# Patient Record
Sex: Female | Born: 1950 | Race: White | Hispanic: No | Marital: Married | Smoking: Former smoker
Health system: Southern US, Community
[De-identification: ages and names within clinical notes are randomized; demographics above are authoritative.]

## PROBLEM LIST (undated history)

## (undated) DIAGNOSIS — I10 Essential (primary) hypertension: Secondary | ICD-10-CM

## (undated) DIAGNOSIS — R51 Headache: Secondary | ICD-10-CM

## (undated) DIAGNOSIS — S0990XA Unspecified injury of head, initial encounter: Secondary | ICD-10-CM

## (undated) DIAGNOSIS — J189 Pneumonia, unspecified organism: Secondary | ICD-10-CM

## (undated) DIAGNOSIS — R011 Cardiac murmur, unspecified: Secondary | ICD-10-CM

## (undated) DIAGNOSIS — F32A Depression, unspecified: Secondary | ICD-10-CM

## (undated) DIAGNOSIS — J45909 Unspecified asthma, uncomplicated: Secondary | ICD-10-CM

## (undated) DIAGNOSIS — K219 Gastro-esophageal reflux disease without esophagitis: Secondary | ICD-10-CM

## (undated) DIAGNOSIS — M47817 Spondylosis without myelopathy or radiculopathy, lumbosacral region: Secondary | ICD-10-CM

## (undated) DIAGNOSIS — R519 Headache, unspecified: Secondary | ICD-10-CM

## (undated) DIAGNOSIS — F419 Anxiety disorder, unspecified: Secondary | ICD-10-CM

## (undated) DIAGNOSIS — M1990 Unspecified osteoarthritis, unspecified site: Secondary | ICD-10-CM

## (undated) DIAGNOSIS — F329 Major depressive disorder, single episode, unspecified: Secondary | ICD-10-CM

## (undated) DIAGNOSIS — Z973 Presence of spectacles and contact lenses: Secondary | ICD-10-CM

## (undated) HISTORY — PX: COLONOSCOPY: SHX174

## (undated) HISTORY — PX: TONSILLECTOMY: SUR1361

## (undated) HISTORY — PX: TUBAL LIGATION: SHX77

## (undated) HISTORY — PX: OTHER SURGICAL HISTORY: SHX169

## (undated) HISTORY — PX: DG THUMB RIGHT HAND (ARMC HX): HXRAD1825

## (undated) HISTORY — PX: LUMBAR FUSION: SHX111

---

## 2016-12-26 ENCOUNTER — Other Ambulatory Visit: Payer: Self-pay | Admitting: Neurosurgery

## 2016-12-26 DIAGNOSIS — M4316 Spondylolisthesis, lumbar region: Secondary | ICD-10-CM

## 2017-01-06 ENCOUNTER — Ambulatory Visit
Admission: RE | Admit: 2017-01-06 | Discharge: 2017-01-06 | Disposition: A | Discharge status: Home / Self Care | Payer: Medicare Other | Source: Ambulatory Visit | Attending: Neurosurgery | Admitting: Neurosurgery

## 2017-01-06 DIAGNOSIS — M4316 Spondylolisthesis, lumbar region: Secondary | ICD-10-CM

## 2017-01-06 MED ORDER — MEPERIDINE HCL 100 MG/ML IJ SOLN
75.0000 mg | Freq: Once | INTRAMUSCULAR | Status: AC
  Administered 2017-01-06: 75 mg via INTRAMUSCULAR

## 2017-01-06 MED ORDER — ONDANSETRON HCL 4 MG/2ML IJ SOLN
4.0000 mg | Freq: Once | INTRAMUSCULAR | Status: AC
Start: 2017-01-06 — End: 2017-01-06
  Administered 2017-01-06: 4 mg via INTRAMUSCULAR

## 2017-01-06 MED ORDER — DIAZEPAM 5 MG PO TABS
10.0000 mg | ORAL_TABLET | Freq: Once | ORAL | Status: AC
  Administered 2017-01-06: 10 mg via ORAL

## 2017-01-06 MED ORDER — IOPAMIDOL (ISOVUE-M 300) INJECTION 61%
10.0000 mL | Freq: Once | INTRAMUSCULAR | Status: AC | PRN
  Administered 2017-01-06: 10 mL via INTRATHECAL

## 2017-01-06 NOTE — Progress Notes (Signed)
Pt states she has been off Tramadol, Wellbutrin and Prozac on Friday.

## 2017-01-06 NOTE — Discharge Instructions (Signed)
Myelogram Discharge Instructions  1. Go home and rest quietly for the next 24 hours.  It is important to lie flat for the next 24 hours.  Get up only to go to the restroom.  You may lie in the bed or on a couch on your back, your stomach, your left side or your right side.  You may have one pillow under your head.  You may have pillows between your knees while you are on your side or under your knees while you are on your back.  2. DO NOT drive today.  Recline the seat as far back as it will go, while still wearing your seat belt, on the way home.  3. You may get up to go to the bathroom as needed.  You may sit up for 10 minutes to eat.  You may resume your normal diet and medications unless otherwise indicated.  Drink lots of extra fluids today and tomorrow.  4. The incidence of headache, nausea, or vomiting is about 5% (one in 20 patients).  If you develop a headache, lie flat and drink plenty of fluids until the headache goes away.  Caffeinated beverages may be helpful.  If you develop severe nausea and vomiting or a headache that does not go away with flat bed rest, call [phone].  5. You may resume normal activities after your 24 hours of bed rest is over; however, do not exert yourself strongly or do any heavy lifting tomorrow. If when you get up you have a headache when standing, go back to bed and force fluids for another 24 hours.  6. Call your physician for a follow-up appointment.  The results of your myelogram will be sent directly to your physician by the following day.  7. If you have any questions or if complications develop after you arrive home, please call [phone].  Discharge instructions have been explained to the patient.  The patient, or the person responsible for the patient, fully understands these instructions.       May resume Tramadol, Wellbutrin and Prozac on Oct. 31, 2018, after 9:30 am.

## 2017-01-08 ENCOUNTER — Other Ambulatory Visit: Payer: Self-pay | Admitting: Neurosurgery

## 2017-02-03 NOTE — Pre-Procedure Instructions (Signed)
Virgia LandMary L Nunes  02/03/2017      [facility] [facility] [place] [address] [place] [id number] Phone: [phone] Fax: [phone]    Your procedure is scheduled on Thursday November 29.  [facility] at 5:30 A.M.  Call this number if you have problems the morning of surgery:  [phone]   Remember:  Do not eat food or drink liquids after midnight.  Take these medicines the morning of surgery with A SIP OF WATER:   Albuterol inhaler Bupropion (wellbutrin) Dicyclomine (bentyl) if needed Fluoxetine (prozac) BREO ellipta loratadine (claritin) Pantoprazole (protonix) Tramadol (ultram) if needed  7 days prior to surgery STOP taking any Aleve, Naproxen, Ibuprofen, Motrin, Advil, Goody's, BC's, all herbal medications, fish oil, and all vitamins  FOLLOW Doctor's instructions on stopping Aspirin, if no instructions were given, please call surgeon.     Do not wear jewelry, make-up or nail polish.  Do not wear lotions, powders, or perfumes, or deoderant.  Do not shave 48 hours prior to surgery.  Men may shave face and neck.  Do not bring valuables to the hospital.  [facility] is not responsible for any belongings or valuables.  Contacts, dentures or bridgework may not be worn into surgery.  Leave your suitcase in the car.  After surgery it may be brought to your room.  For patients admitted to the hospital, discharge time will be determined by your treatment team.  Patients discharged the day of surgery will not be allowed to drive home.   Special instructions:    Echo- Preparing For Surgery  Before surgery, you can play an important role. Because skin is not sterile, your skin needs to be as free of germs as possible. You can reduce the number of germs on your skin by washing with CHG (chlorahexidine gluconate) Soap before surgery.  CHG is an antiseptic cleaner  which kills germs and bonds with the skin to continue killing germs even after washing.  Please do not use if you have an allergy to CHG or antibacterial soaps. If your skin becomes reddened/irritated stop using the CHG.  Do not shave (including legs and underarms) for at least 48 hours prior to first CHG shower. It is OK to shave your face.  Please follow these instructions carefully.   1. Shower the NIGHT BEFORE SURGERY and the MORNING OF SURGERY with CHG.   2. If you chose to wash your hair, wash your hair first as usual with your normal shampoo.  3. After you shampoo, rinse your hair and body thoroughly to remove the shampoo.  4. Use CHG as you would any other liquid soap. You can apply CHG directly to the skin and wash gently with a scrungie or a clean washcloth.   5. Apply the CHG Soap to your body ONLY FROM THE NECK DOWN.  Do not use on open wounds or open sores. Avoid contact with your eyes, ears, mouth and genitals (private parts). Wash Face and genitals (private parts)  with your normal soap.  6. Wash thoroughly, paying special attention to the area where your surgery will be performed.  7. Thoroughly rinse your body with warm water from the neck down.  8. DO NOT shower/wash with your normal soap after using and rinsing off the CHG Soap.  9. Pat yourself dry with a CLEAN TOWEL.  10. Wear CLEAN PAJAMAS to bed the night before surgery,  wear comfortable clothes the morning of surgery  11. Place CLEAN SHEETS on your bed the night of your first shower and DO NOT SLEEP WITH PETS.    Day of Surgery: Do not apply any deodorants/lotions. Please wear clean clothes to the hospital/surgery center.      Please read over the following fact sheets that you were given. Coughing and Deep Breathing and Surgical Site Infection Prevention

## 2017-02-04 ENCOUNTER — Other Ambulatory Visit: Payer: Self-pay

## 2017-02-04 ENCOUNTER — Encounter (HOSPITAL_COMMUNITY): Payer: Self-pay | Admitting: Urology

## 2017-02-04 ENCOUNTER — Encounter (HOSPITAL_COMMUNITY)
Admission: RE | Admit: 2017-02-04 | Discharge: 2017-02-04 | Disposition: A | Discharge status: Home / Self Care | Payer: Medicare Other | Source: Ambulatory Visit | Attending: Neurosurgery | Admitting: Neurosurgery

## 2017-02-04 HISTORY — DX: Headache, unspecified: R51.9

## 2017-02-04 HISTORY — DX: Headache: R51

## 2017-02-04 HISTORY — DX: Gastro-esophageal reflux disease without esophagitis: K21.9

## 2017-02-04 HISTORY — DX: Unspecified osteoarthritis, unspecified site: M19.90

## 2017-02-04 HISTORY — DX: Essential (primary) hypertension: I10

## 2017-02-04 HISTORY — DX: Major depressive disorder, single episode, unspecified: F32.9

## 2017-02-04 HISTORY — DX: Anxiety disorder, unspecified: F41.9

## 2017-02-04 HISTORY — DX: Cardiac murmur, unspecified: R01.1

## 2017-02-04 HISTORY — DX: Unspecified asthma, uncomplicated: J45.909

## 2017-02-04 HISTORY — DX: Depression, unspecified: F32.A

## 2017-02-04 LAB — BASIC METABOLIC PANEL
Anion gap: 8 (ref 5–15)
BUN: 22 mg/dL — AB (ref 6–20)
CHLORIDE: 106 mmol/L (ref 101–111)
CO2: 24 mmol/L (ref 22–32)
CREATININE: 0.84 mg/dL (ref 0.44–1.00)
Calcium: 9.5 mg/dL (ref 8.9–10.3)
Glucose, Bld: 98 mg/dL (ref 65–99)
POTASSIUM: 4.1 mmol/L (ref 3.5–5.1)
SODIUM: 138 mmol/L (ref 135–145)

## 2017-02-04 LAB — TYPE AND SCREEN
ABO/RH(D): A POS
ANTIBODY SCREEN: NEGATIVE

## 2017-02-04 LAB — ABO/RH: ABO/RH(D): A POS

## 2017-02-04 LAB — CBC
HCT: 35.2 % — ABNORMAL LOW (ref 36.0–46.0)
HEMOGLOBIN: 11.3 g/dL — AB (ref 12.0–15.0)
MCH: 29.8 pg (ref 26.0–34.0)
MCHC: 32.1 g/dL (ref 30.0–36.0)
MCV: 92.9 fL (ref 78.0–100.0)
PLATELETS: 330 10*3/uL (ref 150–400)
RBC: 3.79 MIL/uL — AB (ref 3.87–5.11)
RDW: 14.2 % (ref 11.5–15.5)
WBC: 7.9 10*3/uL (ref 4.0–10.5)

## 2017-02-04 LAB — SURGICAL PCR SCREEN
MRSA, PCR: NEGATIVE
STAPHYLOCOCCUS AUREUS: NEGATIVE

## 2017-02-04 MED ORDER — CHLORHEXIDINE GLUCONATE CLOTH 2 % EX PADS: 6.0000 | MEDICATED_PAD | Freq: Once | CUTANEOUS | Status: DC

## 2017-02-04 NOTE — Progress Notes (Signed)
Anesthesia Chart Review: Patient is a 66 year old female scheduled for PLIF L4-5 on 02/05/17 by Dr. Tressie StalkerJeffrey Jenkins. [place] so her PAT appointment was not until this afternoon.  History includes never smoker, HTN, asthma ("mild"), anxiety, depression, GERD, headaches, arthritis, rectal fistula repair, murmur ("had it forever"; not otherwise specified and anesthesia APP not asked to evaluate at PAT).   No PCP is listed. Records received from Daphene JaegerJohn Garlitz, DO who has given her acupuncture treatments and ultimately referred patient to neurosurgery.  Meds include albuterol, aspirin 81 mg, Wellbutrin XL, Bentyl, diltiazem XT, Prempro, Prozac, Breo Ellipta, Claritin, melatonin, Protonix, Crestor, tramadol.   BP 125/65   Pulse 84   Temp 36.9 C   Resp 20   Ht 5\' 2"  (1.575 m)   Wt 136 lb 4.8 oz (61.8 kg)   SpO2 98%   BMI 24.93 kg/m   [place]02[place]17 [place] [place] [facility] [place].  CT cervical/lumbar myelogram 01/06/17: IMPRESSION: CERVICAL MYELOGRAM: 1. Multilevel degenerative changes of the cervical spine, as described above, worst at C6-C7 where there is moderate central spinal canal stenosis and severe bilateral neuroforaminal stenosis. 2. Mild spinal canal and moderate bilateral neuroforaminal stenosis at C5-C6. 3. Advanced left facet uncovertebral hypertrophy resulting in severe left neuroforaminal stenosis at C3-C4 and moderate neuroforaminal stenosis at C4-C5, which could impinge the exiting left C4 and C5 nerve roots and account for the patient's radiculopathy. 4. Grade 1 anterolisthesis at C4-C5 and C7-T1 due to severe bilateral facet arthropathy. LUMBAR MYELOGRAM: 1. Multilevel degenerative changes of the lumbar spine as described above, worst at L4-L5 where there is mild to moderate central spinal canal stenosis and moderate right neuroforaminal stenosis. 2. Mild central spinal canal stenosis at L3-L4. Mild neuroforaminal stenosis on the left  at L3-L4 and on the right at L5-S1. 3. Grade 1 anterolisthesis of L4 on L5, unchanged with flexion, and slightly improved with extension.  Preoperative labs noted. Cr 0.84, glucose 98. H/H 11.3/35.2.   Labs and EKG acceptable for OR. Anesthesiologist to evaluate on the day of surgery.  Velna Ochsllison Devany Aja, PA-C [facility] Phone [phone] 02/04/2017 4:47 PM

## 2017-02-05 ENCOUNTER — Inpatient Hospital Stay (HOSPITAL_COMMUNITY): Payer: Medicare Other | Admitting: Vascular Surgery

## 2017-02-05 ENCOUNTER — Encounter (HOSPITAL_COMMUNITY): Payer: Self-pay | Admitting: Certified Registered Nurse Anesthetist

## 2017-02-05 ENCOUNTER — Inpatient Hospital Stay (HOSPITAL_COMMUNITY)
Admission: RE | Admit: 2017-02-05 | Discharge: 2017-02-06 | DRG: 455 | Disposition: A | Discharge status: Home / Self Care | Payer: Medicare Other | Source: Ambulatory Visit | Attending: Neurosurgery | Admitting: Neurosurgery

## 2017-02-05 ENCOUNTER — Inpatient Hospital Stay (HOSPITAL_COMMUNITY): Payer: Medicare Other

## 2017-02-05 ENCOUNTER — Inpatient Hospital Stay (HOSPITAL_COMMUNITY): Payer: Medicare Other | Admitting: Certified Registered Nurse Anesthetist

## 2017-02-05 ENCOUNTER — Encounter (HOSPITAL_COMMUNITY)
Admission: RE | Disposition: A | Discharge status: Home / Self Care | Payer: Self-pay | Source: Ambulatory Visit | Attending: Neurosurgery

## 2017-02-05 DIAGNOSIS — J45909 Unspecified asthma, uncomplicated: Secondary | ICD-10-CM | POA: Diagnosis present

## 2017-02-05 DIAGNOSIS — K219 Gastro-esophageal reflux disease without esophagitis: Secondary | ICD-10-CM | POA: Diagnosis present

## 2017-02-05 DIAGNOSIS — M5116 Intervertebral disc disorders with radiculopathy, lumbar region: Secondary | ICD-10-CM | POA: Diagnosis present

## 2017-02-05 DIAGNOSIS — Z7982 Long term (current) use of aspirin: Secondary | ICD-10-CM

## 2017-02-05 DIAGNOSIS — Z791 Long term (current) use of non-steroidal anti-inflammatories (NSAID): Secondary | ICD-10-CM | POA: Diagnosis not present

## 2017-02-05 DIAGNOSIS — Z0183 Encounter for blood typing: Secondary | ICD-10-CM | POA: Diagnosis not present

## 2017-02-05 DIAGNOSIS — Z882 Allergy status to sulfonamides status: Secondary | ICD-10-CM

## 2017-02-05 DIAGNOSIS — M4316 Spondylolisthesis, lumbar region: Secondary | ICD-10-CM | POA: Diagnosis present

## 2017-02-05 DIAGNOSIS — M48062 Spinal stenosis, lumbar region with neurogenic claudication: Secondary | ICD-10-CM | POA: Diagnosis present

## 2017-02-05 DIAGNOSIS — Z888 Allergy status to other drugs, medicaments and biological substances status: Secondary | ICD-10-CM

## 2017-02-05 DIAGNOSIS — Z91013 Allergy to seafood: Secondary | ICD-10-CM | POA: Diagnosis not present

## 2017-02-05 DIAGNOSIS — Z886 Allergy status to analgesic agent status: Secondary | ICD-10-CM | POA: Diagnosis not present

## 2017-02-05 DIAGNOSIS — M549 Dorsalgia, unspecified: Secondary | ICD-10-CM | POA: Diagnosis present

## 2017-02-05 DIAGNOSIS — Z01812 Encounter for preprocedural laboratory examination: Secondary | ICD-10-CM

## 2017-02-05 DIAGNOSIS — F329 Major depressive disorder, single episode, unspecified: Secondary | ICD-10-CM | POA: Diagnosis present

## 2017-02-05 DIAGNOSIS — Z419 Encounter for procedure for purposes other than remedying health state, unspecified: Secondary | ICD-10-CM

## 2017-02-05 DIAGNOSIS — Z79890 Hormone replacement therapy (postmenopausal): Secondary | ICD-10-CM

## 2017-02-05 SURGERY — POSTERIOR LUMBAR FUSION 1 LEVEL
Anesthesia: General

## 2017-02-05 MED ORDER — FENTANYL CITRATE (PF) 250 MCG/5ML IJ SOLN
INTRAMUSCULAR | Status: AC
  Filled 2017-02-05: qty 5

## 2017-02-05 MED ORDER — CEFAZOLIN SODIUM-DEXTROSE 2-4 GM/100ML-% IV SOLN
2.0000 g | INTRAVENOUS | Status: AC
  Administered 2017-02-05: 2 g via INTRAVENOUS
  Filled 2017-02-05: qty 100

## 2017-02-05 MED ORDER — ROCURONIUM BROMIDE 10 MG/ML (PF) SYRINGE
PREFILLED_SYRINGE | INTRAVENOUS | Status: DC | PRN
  Administered 2017-02-05: 10 mg via INTRAVENOUS
  Administered 2017-02-05: 20 mg via INTRAVENOUS
  Administered 2017-02-05: 50 mg via INTRAVENOUS
  Administered 2017-02-05: 20 mg via INTRAVENOUS
  Administered 2017-02-05: 10 mg via INTRAVENOUS

## 2017-02-05 MED ORDER — FENTANYL CITRATE (PF) 100 MCG/2ML IJ SOLN
INTRAMUSCULAR | Status: DC | PRN
  Administered 2017-02-05 (×5): 50 ug via INTRAVENOUS

## 2017-02-05 MED ORDER — PHENOL 1.4 % MT LIQD: 1.0000 | OROMUCOSAL | Status: DC | PRN

## 2017-02-05 MED ORDER — MIDAZOLAM HCL 5 MG/5ML IJ SOLN
INTRAMUSCULAR | Status: DC | PRN
  Administered 2017-02-05: 1 mg via INTRAVENOUS

## 2017-02-05 MED ORDER — OXYCODONE HCL 5 MG PO TABS
5.0000 mg | ORAL_TABLET | Freq: Once | ORAL | Status: AC | PRN
  Administered 2017-02-05: 5 mg via ORAL

## 2017-02-05 MED ORDER — ROSUVASTATIN CALCIUM 20 MG PO TABS: 20.0000 mg | ORAL_TABLET | Freq: Every evening | ORAL | Status: DC

## 2017-02-05 MED ORDER — OXYCODONE HCL 5 MG PO TABS: 5.0000 mg | ORAL_TABLET | ORAL | Status: DC | PRN

## 2017-02-05 MED ORDER — SODIUM CHLORIDE 0.9% FLUSH
3.0000 mL | Freq: Two times a day (BID) | INTRAVENOUS | Status: DC
  Administered 2017-02-05 (×2): 3 mL via INTRAVENOUS

## 2017-02-05 MED ORDER — PANTOPRAZOLE SODIUM 40 MG PO TBEC
40.0000 mg | DELAYED_RELEASE_TABLET | Freq: Every day | ORAL | Status: DC | PRN
  Filled 2017-02-05: qty 1

## 2017-02-05 MED ORDER — SODIUM CHLORIDE 0.9% FLUSH: 3.0000 mL | INTRAVENOUS | Status: DC | PRN

## 2017-02-05 MED ORDER — ONDANSETRON HCL 4 MG/2ML IJ SOLN
INTRAMUSCULAR | Status: AC
  Filled 2017-02-05: qty 2

## 2017-02-05 MED ORDER — BUPROPION HCL ER (XL) 300 MG PO TB24
300.0000 mg | ORAL_TABLET | Freq: Every day | ORAL | Status: DC
  Filled 2017-02-05: qty 1

## 2017-02-05 MED ORDER — ZOLPIDEM TARTRATE 5 MG PO TABS: 5.0000 mg | ORAL_TABLET | Freq: Every evening | ORAL | Status: DC | PRN

## 2017-02-05 MED ORDER — OXYCODONE HCL 5 MG PO TABS
ORAL_TABLET | ORAL | Status: AC
Start: 2017-02-05 — End: 2017-02-06
  Filled 2017-02-05: qty 1

## 2017-02-05 MED ORDER — LORATADINE 10 MG PO TABS: 10.0000 mg | ORAL_TABLET | Freq: Every day | ORAL | Status: DC | PRN

## 2017-02-05 MED ORDER — 0.9 % SODIUM CHLORIDE (POUR BTL) OPTIME
TOPICAL | Status: DC | PRN
  Administered 2017-02-05: 1000 mL

## 2017-02-05 MED ORDER — CEFAZOLIN SODIUM-DEXTROSE 2-4 GM/100ML-% IV SOLN
2.0000 g | Freq: Three times a day (TID) | INTRAVENOUS | Status: AC
  Administered 2017-02-05 (×2): 2 g via INTRAVENOUS
  Filled 2017-02-05 (×2): qty 100

## 2017-02-05 MED ORDER — BUPIVACAINE HCL (PF) 0.5 % IJ SOLN
INTRAMUSCULAR | Status: AC
  Filled 2017-02-05: qty 30

## 2017-02-05 MED ORDER — HYDROMORPHONE HCL 1 MG/ML IJ SOLN
INTRAMUSCULAR | Status: AC
  Administered 2017-02-05: 0.5 mg via INTRAVENOUS
  Filled 2017-02-05: qty 1

## 2017-02-05 MED ORDER — ALBUTEROL SULFATE (2.5 MG/3ML) 0.083% IN NEBU: 2.5000 mg | INHALATION_SOLUTION | RESPIRATORY_TRACT | Status: DC | PRN

## 2017-02-05 MED ORDER — LACTATED RINGERS IV SOLN
INTRAVENOUS | Status: DC | PRN
  Administered 2017-02-05: 07:00:00 via INTRAVENOUS

## 2017-02-05 MED ORDER — VANCOMYCIN HCL 1000 MG IV SOLR
INTRAVENOUS | Status: DC | PRN
  Administered 2017-02-05: 1000 mg

## 2017-02-05 MED ORDER — PROPOFOL 10 MG/ML IV BOLUS
INTRAVENOUS | Status: DC | PRN
  Administered 2017-02-05: 150 mg via INTRAVENOUS

## 2017-02-05 MED ORDER — HYDROMORPHONE HCL 1 MG/ML IJ SOLN
INTRAMUSCULAR | Status: AC
  Filled 2017-02-05: qty 1

## 2017-02-05 MED ORDER — DOCUSATE SODIUM 100 MG PO CAPS
100.0000 mg | ORAL_CAPSULE | Freq: Two times a day (BID) | ORAL | Status: DC
  Administered 2017-02-05 – 2017-02-06 (×2): 100 mg via ORAL
  Filled 2017-02-05 (×2): qty 1

## 2017-02-05 MED ORDER — VANCOMYCIN HCL 1000 MG IV SOLR
INTRAVENOUS | Status: AC
  Filled 2017-02-05: qty 1000

## 2017-02-05 MED ORDER — BISACODYL 10 MG RE SUPP: 10.0000 mg | Freq: Every day | RECTAL | Status: DC | PRN

## 2017-02-05 MED ORDER — SUGAMMADEX SODIUM 200 MG/2ML IV SOLN
INTRAVENOUS | Status: DC | PRN
  Administered 2017-02-05: 150 mg via INTRAVENOUS

## 2017-02-05 MED ORDER — FLUTICASONE FUROATE-VILANTEROL 100-25 MCG/INH IN AEPB
1.0000 | INHALATION_SPRAY | Freq: Every day | RESPIRATORY_TRACT | Status: DC | PRN
  Filled 2017-02-05: qty 28

## 2017-02-05 MED ORDER — ACETAMINOPHEN 650 MG RE SUPP: 650.0000 mg | RECTAL | Status: DC | PRN

## 2017-02-05 MED ORDER — MORPHINE SULFATE (PF) 4 MG/ML IV SOLN
4.0000 mg | INTRAVENOUS | Status: DC | PRN
  Administered 2017-02-05: 4 mg via INTRAVENOUS
  Filled 2017-02-05: qty 1

## 2017-02-05 MED ORDER — DICYCLOMINE HCL 20 MG PO TABS
20.0000 mg | ORAL_TABLET | Freq: Every day | ORAL | Status: DC | PRN
  Filled 2017-02-05: qty 1

## 2017-02-05 MED ORDER — FLUOXETINE HCL 20 MG PO CAPS
40.0000 mg | ORAL_CAPSULE | Freq: Every day | ORAL | Status: DC
  Administered 2017-02-06: 40 mg via ORAL
  Filled 2017-02-05: qty 2

## 2017-02-05 MED ORDER — ALBUMIN HUMAN 5 % IV SOLN
INTRAVENOUS | Status: DC | PRN
  Administered 2017-02-05: 09:00:00 via INTRAVENOUS

## 2017-02-05 MED ORDER — CYCLOBENZAPRINE HCL 10 MG PO TABS
ORAL_TABLET | ORAL | Status: AC
  Administered 2017-02-05: 10 mg via ORAL
  Filled 2017-02-05: qty 1

## 2017-02-05 MED ORDER — HEMOSTATIC AGENTS (NO CHARGE) OPTIME
TOPICAL | Status: DC | PRN
  Administered 2017-02-05: 1 via TOPICAL

## 2017-02-05 MED ORDER — PHENYLEPHRINE HCL 10 MG/ML IJ SOLN
INTRAVENOUS | Status: DC | PRN
  Administered 2017-02-05: 15 ug/min via INTRAVENOUS

## 2017-02-05 MED ORDER — THROMBIN (RECOMBINANT) 5000 UNITS EX SOLR
CUTANEOUS | Status: AC
  Filled 2017-02-05: qty 5000

## 2017-02-05 MED ORDER — VITAMIN D 1000 UNITS PO TABS
2000.0000 [IU] | ORAL_TABLET | Freq: Every day | ORAL | Status: DC
  Filled 2017-02-05: qty 2

## 2017-02-05 MED ORDER — ACETAMINOPHEN 325 MG PO TABS
650.0000 mg | ORAL_TABLET | ORAL | Status: DC | PRN
  Administered 2017-02-06: 650 mg via ORAL
  Filled 2017-02-05: qty 2

## 2017-02-05 MED ORDER — LIDOCAINE 2% (20 MG/ML) 5 ML SYRINGE
INTRAMUSCULAR | Status: AC
  Filled 2017-02-05: qty 10

## 2017-02-05 MED ORDER — ONDANSETRON HCL 4 MG PO TABS: 4.0000 mg | ORAL_TABLET | Freq: Four times a day (QID) | ORAL | Status: DC | PRN

## 2017-02-05 MED ORDER — BACITRACIN ZINC 500 UNIT/GM EX OINT
TOPICAL_OINTMENT | CUTANEOUS | Status: DC | PRN
  Administered 2017-02-05: 1 via TOPICAL

## 2017-02-05 MED ORDER — HYDROMORPHONE HCL 1 MG/ML IJ SOLN
0.2500 mg | INTRAMUSCULAR | Status: DC | PRN
  Administered 2017-02-05 (×3): 0.5 mg via INTRAVENOUS

## 2017-02-05 MED ORDER — BUPIVACAINE LIPOSOME 1.3 % IJ SUSP
20.0000 mL | INTRAMUSCULAR | Status: AC
  Administered 2017-02-05: 20 mL
  Filled 2017-02-05: qty 20

## 2017-02-05 MED ORDER — LIDOCAINE 2% (20 MG/ML) 5 ML SYRINGE
INTRAMUSCULAR | Status: DC | PRN
  Administered 2017-02-05: 60 mg via INTRAVENOUS

## 2017-02-05 MED ORDER — DEXAMETHASONE SODIUM PHOSPHATE 10 MG/ML IJ SOLN
INTRAMUSCULAR | Status: DC | PRN
  Administered 2017-02-05: 10 mg via INTRAVENOUS

## 2017-02-05 MED ORDER — ONDANSETRON HCL 4 MG/2ML IJ SOLN: 4.0000 mg | Freq: Four times a day (QID) | INTRAMUSCULAR | Status: DC | PRN

## 2017-02-05 MED ORDER — ROCURONIUM BROMIDE 10 MG/ML (PF) SYRINGE
PREFILLED_SYRINGE | INTRAVENOUS | Status: AC
  Filled 2017-02-05: qty 10

## 2017-02-05 MED ORDER — CYCLOBENZAPRINE HCL 10 MG PO TABS
10.0000 mg | ORAL_TABLET | Freq: Three times a day (TID) | ORAL | Status: DC | PRN
  Administered 2017-02-05 – 2017-02-06 (×4): 10 mg via ORAL
  Filled 2017-02-05 (×3): qty 1

## 2017-02-05 MED ORDER — DILTIAZEM HCL ER COATED BEADS 300 MG PO CP24
300.0000 mg | ORAL_CAPSULE | Freq: Every evening | ORAL | Status: DC
Start: 2017-02-05 — End: 2017-02-06
  Administered 2017-02-05: 300 mg via ORAL
  Filled 2017-02-05 (×2): qty 1

## 2017-02-05 MED ORDER — OXYCODONE HCL 5 MG PO TABS
10.0000 mg | ORAL_TABLET | ORAL | Status: DC | PRN
  Administered 2017-02-05 – 2017-02-06 (×7): 10 mg via ORAL
  Filled 2017-02-05 (×7): qty 2

## 2017-02-05 MED ORDER — DEXAMETHASONE SODIUM PHOSPHATE 10 MG/ML IJ SOLN
INTRAMUSCULAR | Status: AC
  Filled 2017-02-05: qty 1

## 2017-02-05 MED ORDER — OXYCODONE HCL 5 MG/5ML PO SOLN: 5.0000 mg | Freq: Once | ORAL | Status: AC | PRN

## 2017-02-05 MED ORDER — THROMBIN (RECOMBINANT) 20000 UNITS EX SOLR
CUTANEOUS | Status: AC
  Filled 2017-02-05: qty 20000

## 2017-02-05 MED ORDER — MENTHOL 3 MG MT LOZG: 1.0000 | LOZENGE | OROMUCOSAL | Status: DC | PRN

## 2017-02-05 MED ORDER — FENOFIBRATE 160 MG PO TABS
160.0000 mg | ORAL_TABLET | Freq: Every day | ORAL | Status: DC
Start: 2017-02-05 — End: 2017-02-06
  Filled 2017-02-05: qty 1

## 2017-02-05 MED ORDER — CONJ ESTROG-MEDROXYPROGEST ACE 0.3-1.5 MG PO TABS
1.0000 | ORAL_TABLET | Freq: Every day | ORAL | Status: DC
  Filled 2017-02-05: qty 1

## 2017-02-05 MED ORDER — BUPIVACAINE-EPINEPHRINE (PF) 0.5% -1:200000 IJ SOLN
INTRAMUSCULAR | Status: DC | PRN
  Administered 2017-02-05: 10 mL via PERINEURAL

## 2017-02-05 MED ORDER — MIDAZOLAM HCL 2 MG/2ML IJ SOLN
INTRAMUSCULAR | Status: AC
  Filled 2017-02-05: qty 2

## 2017-02-05 MED ORDER — PROPOFOL 10 MG/ML IV BOLUS
INTRAVENOUS | Status: AC
  Filled 2017-02-05: qty 40

## 2017-02-05 MED ORDER — LACTATED RINGERS IV SOLN
INTRAVENOUS | Status: DC
  Administered 2017-02-05: 15:00:00 via INTRAVENOUS

## 2017-02-05 MED ORDER — SODIUM CHLORIDE 0.9 % IR SOLN
Status: DC | PRN
  Administered 2017-02-05: 08:00:00

## 2017-02-05 MED ORDER — ONDANSETRON HCL 4 MG/2ML IJ SOLN
INTRAMUSCULAR | Status: DC | PRN
  Administered 2017-02-05: 4 mg via INTRAVENOUS

## 2017-02-05 MED ORDER — THROMBIN (RECOMBINANT) 5000 UNITS EX SOLR
CUTANEOUS | Status: DC | PRN
  Administered 2017-02-05: 5000 [IU] via TOPICAL

## 2017-02-05 MED ORDER — BACITRACIN ZINC 500 UNIT/GM EX OINT
TOPICAL_OINTMENT | CUTANEOUS | Status: AC
  Filled 2017-02-05: qty 28.35

## 2017-02-05 MED FILL — Heparin Sodium (Porcine) Inj 1000 Unit/ML: INTRAMUSCULAR | Qty: 30 | Status: AC

## 2017-02-05 MED FILL — Sodium Chloride IV Soln 0.9%: INTRAVENOUS | Qty: 1000 | Status: AC

## 2017-02-05 SURGICAL SUPPLY — 63 items
BAG DECANTER FOR FLEXI CONT (MISCELLANEOUS) ×3 IMPLANT
BASKET BONE COLLECTION (BASKET) ×3 IMPLANT
BENZOIN TINCTURE PRP APPL 2/3 (GAUZE/BANDAGES/DRESSINGS) ×3 IMPLANT
BLADE CLIPPER SURG (BLADE) IMPLANT
BUR MATCHSTICK NEURO 3.0 LAGG (BURR) ×3 IMPLANT
BUR PRECISION FLUTE 6.0 (BURR) ×3 IMPLANT
CAGE ALTERA 10X31X9-13 15D (Cage) ×2 IMPLANT
CAGE ALTERA 9-13-15-31MM (Cage) ×1 IMPLANT
CANISTER SUCT 3000ML PPV (MISCELLANEOUS) ×3 IMPLANT
CAP REVERE LOCKING (Cap) ×12 IMPLANT
CARTRIDGE OIL MAESTRO DRILL (MISCELLANEOUS) ×1 IMPLANT
CLOSURE WOUND 1/2 X4 (GAUZE/BANDAGES/DRESSINGS) ×1
CONT SPEC 4OZ CLIKSEAL STRL BL (MISCELLANEOUS) ×3 IMPLANT
COVER BACK TABLE 60X90IN (DRAPES) ×6 IMPLANT
DERMABOND ADVANCED (GAUZE/BANDAGES/DRESSINGS) ×2
DERMABOND ADVANCED .7 DNX12 (GAUZE/BANDAGES/DRESSINGS) ×1 IMPLANT
DIFFUSER DRILL AIR PNEUMATIC (MISCELLANEOUS) ×3 IMPLANT
DRAPE C-ARM 42X72 X-RAY (DRAPES) ×6 IMPLANT
DRAPE HALF SHEET 40X57 (DRAPES) ×3 IMPLANT
DRAPE LAPAROTOMY 100X72X124 (DRAPES) ×3 IMPLANT
DRAPE POUCH INSTRU U-SHP 10X18 (DRAPES) ×3 IMPLANT
DRAPE SURG 17X23 STRL (DRAPES) ×12 IMPLANT
ELECT BLADE 4.0 EZ CLEAN MEGAD (MISCELLANEOUS) ×3
ELECT REM PT RETURN 9FT ADLT (ELECTROSURGICAL) ×3
ELECTRODE BLDE 4.0 EZ CLN MEGD (MISCELLANEOUS) ×1 IMPLANT
ELECTRODE REM PT RTRN 9FT ADLT (ELECTROSURGICAL) ×1 IMPLANT
EVACUATOR 1/8 PVC DRAIN (DRAIN) IMPLANT
GAUZE SPONGE 4X4 12PLY STRL (GAUZE/BANDAGES/DRESSINGS) ×3 IMPLANT
GAUZE SPONGE 4X4 16PLY XRAY LF (GAUZE/BANDAGES/DRESSINGS) ×3 IMPLANT
GLOVE BIO SURGEON STRL SZ8 (GLOVE) ×6 IMPLANT
GLOVE BIO SURGEON STRL SZ8.5 (GLOVE) ×6 IMPLANT
GLOVE EXAM NITRILE LRG STRL (GLOVE) IMPLANT
GLOVE EXAM NITRILE XL STR (GLOVE) IMPLANT
GLOVE EXAM NITRILE XS STR PU (GLOVE) IMPLANT
GOWN STRL REUS W/ TWL LRG LVL3 (GOWN DISPOSABLE) ×1 IMPLANT
GOWN STRL REUS W/ TWL XL LVL3 (GOWN DISPOSABLE) ×2 IMPLANT
GOWN STRL REUS W/TWL 2XL LVL3 (GOWN DISPOSABLE) ×3 IMPLANT
GOWN STRL REUS W/TWL LRG LVL3 (GOWN DISPOSABLE) ×2
GOWN STRL REUS W/TWL XL LVL3 (GOWN DISPOSABLE) ×4
HEMOSTAT POWDER SURGIFOAM 1G (HEMOSTASIS) ×3 IMPLANT
KIT BASIN OR (CUSTOM PROCEDURE TRAY) ×3 IMPLANT
KIT ROOM TURNOVER OR (KITS) ×3 IMPLANT
NEEDLE HYPO 21X1.5 SAFETY (NEEDLE) IMPLANT
NEEDLE HYPO 22GX1.5 SAFETY (NEEDLE) ×3 IMPLANT
NS IRRIG 1000ML POUR BTL (IV SOLUTION) ×3 IMPLANT
OIL CARTRIDGE MAESTRO DRILL (MISCELLANEOUS) ×3
PACK LAMINECTOMY NEURO (CUSTOM PROCEDURE TRAY) ×3 IMPLANT
PAD ARMBOARD 7.5X6 YLW CONV (MISCELLANEOUS) ×9 IMPLANT
PATTIES SURGICAL .5 X1 (DISPOSABLE) IMPLANT
ROD REVERE 6.35 40MM (Rod) ×6 IMPLANT
SCREW 7.5X45MM (Screw) ×12 IMPLANT
SPONGE LAP 4X18 X RAY DECT (DISPOSABLE) IMPLANT
SPONGE NEURO XRAY DETECT 1X3 (DISPOSABLE) ×3 IMPLANT
SPONGE SURGIFOAM ABS GEL 100 (HEMOSTASIS) IMPLANT
STRIP BIOACTIVE 20CC 25X100X8 (Miscellaneous) ×3 IMPLANT
STRIP CLOSURE SKIN 1/2X4 (GAUZE/BANDAGES/DRESSINGS) ×2 IMPLANT
SUT VIC AB 1 CT1 18XBRD ANBCTR (SUTURE) ×2 IMPLANT
SUT VIC AB 1 CT1 8-18 (SUTURE) ×4
SUT VIC AB 2-0 CP2 18 (SUTURE) ×6 IMPLANT
TOWEL GREEN STERILE (TOWEL DISPOSABLE) ×3 IMPLANT
TOWEL GREEN STERILE FF (TOWEL DISPOSABLE) ×3 IMPLANT
TRAY FOLEY W/METER SILVER 16FR (SET/KITS/TRAYS/PACK) ×3 IMPLANT
WATER STERILE IRR 1000ML POUR (IV SOLUTION) ×3 IMPLANT

## 2017-02-05 NOTE — H&P (Signed)
Subjective: The patient is a 66 year old white female who has complained of back, buttock and leg pain consistent with neurogenic claudication.  She has failed medical management and was worked up with a lumbar myelo CT.  This demonstrated a L4-5 spinal stasis with spinal stenosis.  I discussed the various treatment options with the patient.  She has decided to proceed with surgery.  Past Medical History:  Diagnosis Date  . Anxiety   . Arthritis   . Asthma    "mild"  . Depression   . GERD (gastroesophageal reflux disease)   . Headache   . Heart murmur    "had it forever"   . Hypertension     Past Surgical History:  Procedure Laterality Date  . CESAREAN SECTION    . DG THUMB RIGHT HAND ([facility] HX)     joint replacement  . rectal fistula repair    . TUBAL LIGATION      Allergies  Allergen Reactions  . Phoslo [Calcium Acetate] Other (See Comments)    Body aches   . Shellfish Allergy Hives  . Sulfa Antibiotics Hives  . Mobic [Meloxicam] Nausea Only    Social History   Tobacco Use  . Smoking status: Never Smoker  . Smokeless tobacco: Never Used  Substance Use Topics  . Alcohol use: Yes    Alcohol/week: 1.8 oz    Types: 3 Glasses of wine per week    Comment: weekly     History reviewed. No pertinent family history. Prior to Admission medications   Medication Sig Start Date End Date Taking? Authorizing Provider  aspirin EC 81 MG tablet Take 81 mg daily by mouth.   Yes [provider]  buPROPion (WELLBUTRIN XL) 300 MG 24 hr tablet Take 300 mg by mouth daily.   Yes [provider]  CALCIUM PO Take 1,200 mg daily by mouth.   Yes [provider]  Cholecalciferol (VITAMIN D) 2000 units CAPS Take 2,000 Units daily by mouth.   Yes [provider]  dicyclomine (BENTYL) 20 MG tablet Take 20 mg daily as needed by mouth (upset stomach).    Yes [provider]  diltiazem (CARTIA XT) 300 MG 24 hr capsule Take 300 mg every evening by mouth.     Yes [provider]  estrogen, conjugated,-medroxyprogesterone (PREMPRO) 0.3-1.5 MG tablet Take 0.5 tablets daily by mouth.    Yes [provider]  fenofibrate micronized (LOFIBRA) 134 MG capsule Take 134 mg every evening by mouth.    Yes [provider]  FLUoxetine (PROZAC) 40 MG capsule Take 40 mg by mouth daily.   Yes [provider]  fluticasone furoate-vilanterol (BREO ELLIPTA) 100-25 MCG/INH AEPB Inhale 1 puff daily as needed into the lungs (shortness of breath).    Yes [provider]  loratadine (CLARITIN) 10 MG tablet Take 10 mg daily as needed by mouth for allergies or rhinitis.   Yes [provider]  Melatonin 10 MG CAPS Take 10 mg at bedtime by mouth.    Yes [provider]  Multiple Vitamins-Minerals (HAIR/SKIN/NAILS) TABS Take 2 tablets daily at 12 noon by mouth.   Yes [provider]  naproxen sodium (ANAPROX) 220 MG tablet Take 220 mg daily by mouth.    Yes [provider]  pantoprazole (PROTONIX) 40 MG tablet Take 40 mg daily as needed by mouth (acid reflux).   Yes [provider]  rosuvastatin (CRESTOR) 20 MG tablet Take 20 mg every evening by mouth.    Yes  [provider]  traMADol (ULTRAM) 50 MG tablet Take 50 mg 4 (four) times daily as needed by mouth for severe pain.    Yes [provider]  Vaginal Lubricant (REPLENS) GEL Place 1 application 2 (two) times a week vaginally.   Yes [provider]  albuterol (PROVENTIL HFA;VENTOLIN HFA) 108 (90 Base) MCG/ACT inhaler Inhale 1 puff every 4 (four) hours as needed into the lungs for wheezing or shortness of breath.     [provider]     Review of Systems  Positive ROS: As above  All other systems have been reviewed and were otherwise negative with the exception of those mentioned in the HPI and as above.  Objective: Vital signs in last 24 hours: Temp:  [98.4 F (36.9 C)] 98.4 F (36.9 C) (11/29  0646) Pulse Rate:  [84] 84 (11/29 0645) Resp:  [20] 20 (11/29 0645) BP: (125-151)/(65-79) 151/79 (11/29 0645) SpO2:  [98 %] 98 % (11/29 0645) Weight:  [61.8 kg (136 lb 4.8 oz)] 61.8 kg (136 lb 4.8 oz) (11/28 1453)  General Appearance: Alert Head: Normocephalic, without obvious abnormality, atraumatic Eyes: PERRL, conjunctiva/corneas clear, EOM's intact,    Ears: Normal  Throat: Normal  Neck: Supple, Back: unremarkable Lungs: Clear to auscultation bilaterally, respirations unlabored Heart: Regular rate and rhythm, no murmur, rub or gallop Abdomen: Soft, non-tender Extremities: Extremities normal, atraumatic, no cyanosis or edema Skin: unremarkable  NEUROLOGIC:   Mental status: alert and oriented,Motor Exam - grossly normal Sensory Exam - grossly normal Reflexes:  Coordination - grossly normal Gait - grossly normal Balance - grossly normal Cranial Nerves: I: smell Not tested  II: visual acuity  OS: Normal  OD: Normal   II: visual fields Full to confrontation  II: pupils Equal, round, reactive to light  III,VII: ptosis None  III,IV,VI: extraocular muscles  Full ROM  V: mastication Normal  V: facial light touch sensation  Normal  V,VII: corneal reflex  Present  VII: facial muscle function - upper  Normal  VII: facial muscle function - lower Normal  VIII: hearing Not tested  IX: soft palate elevation  Normal  IX,X: gag reflex Present  XI: trapezius strength  5/5  XI: sternocleidomastoid strength 5/5  XI: neck flexion strength  5/5  XII: tongue strength  Normal    Data Review Lab Results  Component Value Date   WBC 7.9 02/04/2017   HGB 11.3 (L) 02/04/2017   HCT 35.2 (L) 02/04/2017   MCV 92.9 02/04/2017   PLT 330 02/04/2017   Lab Results  Component Value Date   NA 138 02/04/2017   K 4.1 02/04/2017   CL 106 02/04/2017   CO2 24 02/04/2017   BUN 22 (H) 02/04/2017   CREATININE 0.84 02/04/2017   GLUCOSE 98 02/04/2017   No results found for: INR,  PROTIME  Assessment/Plan: L4-5 spondylolisthesis, spinal stenosis, neurogenic claudication, lumbar radiculopathy, lumbago: I have discussed the situation with the patient.  I have reviewed her imaging studies with her and pointed out the abnormalities.  We have discussed the various treatment options including surgery.  I have described the surgical treatment option of an L4-5 decompression, instrumentation, and fusion.  I have shown her surgical models.  I have given her a surgical pamphlet.  We have discussed the risks, benefits, alternatives, expected postoperative course, and likelihood of achieving our goals with surgery.  I have answered all her questions.  She has decided to proceed with surgery.   Cristi LoronJeffrey D Alek Borges 02/05/2017 7:23 AM

## 2017-02-05 NOTE — Progress Notes (Signed)
Subjective:   I saw the patient in the PACU.  She was alert and pleasant and in no apparent distress.  Objective: Vital signs in last 24 hours: Temp:  [97.2 F (36.2 C)-99 F (37.2 C)] 99 F (37.2 C) (11/29 1342) Pulse Rate:  [84-104] 104 (11/29 1300) Resp:  [11-20] 18 (11/29 1342) BP: (118-151)/(72-80) 120/72 (11/29 1342) SpO2:  [94 %-98 %] 94 % (11/29 1342)  Intake/Output from previous day: No intake/output data recorded. Intake/Output this shift: Total I/O In: 1550 [I.V.:1200; IV Piggyback:350] Out: 510 [Urine:360; Blood:150]  Physical exam   The patient is alert and pleasant.  She is moving her lower extremities well.  Lab Results: Recent Labs    02/04/17 1524  WBC 7.9  HGB 11.3*  HCT 35.2*  PLT 330   BMET Recent Labs    02/04/17 1524  NA 138  K 4.1  CL 106  CO2 24  GLUCOSE 98  BUN 22*  CREATININE 0.84  CALCIUM 9.5    Studies/Results: Dg Lumbar Spine 2-3 Views  Result Date: 02/05/2017 CLINICAL DATA:  Lumbar spine surgery. EXAM: DG C-ARM 61-120 MIN; LUMBAR SPINE - 2-3 VIEW COMPARISON:  02/05/2017. FINDINGS: Lumbar spine numbered as per prior exam. L4-L5 posterior pedicle screws. L4-L5 interbody fusion. Hardware intact. Stable anatomic alignment. IMPRESSION: Postsurgical changes lumbar spine. Electronically Signed   By: Maisie Fushomas  Register   On: 02/05/2017 11:13   Dg Lumbar Spine 1 View  Result Date: 02/05/2017 CLINICAL DATA:  Lumbar spine surgery. EXAM: LUMBAR SPINE - 1 VIEW COMPARISON:  No prior. FINDINGS: Lumbar spine numbered with the lowest segmented appearing lumbar shaped vertebrae on lateral view as L5. Metallic marker noted posteriorly at the L5 level. Surgical sponge noted over the posterior lower back. Mild anterolisthesis L4 on L5. Mild L1 compression fracture. IMPRESSION: Metallic marker noted posteriorly at the L5 level. Electronically Signed   By: Maisie Fushomas  Register   On: 02/05/2017 09:25   Dg C-arm 1-60 Min  Result Date: 02/05/2017 CLINICAL  DATA:  Lumbar spine surgery. EXAM: DG C-ARM 61-120 MIN; LUMBAR SPINE - 2-3 VIEW COMPARISON:  02/05/2017. FINDINGS: Lumbar spine numbered as per prior exam. L4-L5 posterior pedicle screws. L4-L5 interbody fusion. Hardware intact. Stable anatomic alignment. IMPRESSION: Postsurgical changes lumbar spine. Electronically Signed   By: Maisie Fushomas  Register   On: 02/05/2017 11:13    Assessment/Plan:   The patient is doing well.  I spoke with her husband.  LOS: 0 days     Cristi LoronJeffrey D Ohana Birdwell 02/05/2017, 2:55 PM

## 2017-02-05 NOTE — Op Note (Signed)
Brief history: The patient is a 66 year old white female who has complained of back, buttock and leg pain consistent with neurogenic claudication.  She was worked up with a lumbar myelo CT which demonstrated an L4-5 spondylolisthesis, facet arthropathy, spinal stenosis, etc.  I discussed the various treatment options with the patient including surgery.  She has weighed the risks, benefits, and alternatives to surgery and decided proceed with an L4-5 decompression, instrumentation, and fusion.  Preoperative diagnosis: L4-5 spondylolisthesis, facet arthropathy, degenerative disc disease, spinal stenosis compressing both the L4 and the L5 nerve roots; lumbago; lumbar radiculopathy: Neurogenic claudication  Postoperative diagnosis: The same  Procedure: Bilateral L4-5 laminotomy/foraminotomies to decompress the bilateral L4 and L5 nerve roots(the work required to do this was in addition to the work required to do the posterior lumbar interbody fusion because of the patient's spinal stenosis, facet arthropathy. Etc. requiring a wide decompression of the nerve roots.);  L4-5 transforaminal lumbar interbody fusion with local morselized autograft bone and Kinnex graft extender; insertion of interbody prosthesis at L4-5 (globus peek expandable interbody prosthesis); posterior nonsegmental instrumentation from L4 to L5 with globus titanium pedicle screws and rods; posterior lateral arthrodesis at L4-5 with local morselized autograft bone and Kinnex bone graft extender.  Surgeon: Dr. Delma OfficerJeff Adreanne Yono  Asst.: Dr. Wynetta Emeryram  Anesthesia: Gen. endotracheal  Estimated blood loss: 200 cc  Drains: None  Complications: None  Description of procedure: The patient was brought to the operating room by the anesthesia team. General endotracheal anesthesia was induced. The patient was turned to the prone position on the Wilson frame. The patient's lumbosacral region was then prepared with Betadine scrub and Betadine solution.  Sterile drapes were applied.  I then injected the area to be incised with Marcaine with epinephrine solution. I then used the scalpel to make a linear midline incision over the L4-5 interspace. I then used electrocautery to perform a bilateral subperiosteal dissection exposing the spinous process and lamina of L4-5. We then obtained intraoperative radiograph to confirm our location. We then inserted the Verstrac retractor to provide exposure.  I began the decompression by using the high speed drill to perform laminotomies at L4-5 bilaterally. We then used the Kerrison punches to widen the laminotomy and removed the ligamentum flavum at L4-5 bilaterally. We used the Kerrison punches to remove the medial facets at L4-5 bilaterally. We performed wide foraminotomies about the bilateral L4 and L5 nerve roots completing the decompression.  We now turned our attention to the posterior lumbar interbody fusion. I used a scalpel to incise the intervertebral disc at L4-5 bilaterally. I then performed a partial intervertebral discectomy at L4-5 bilaterally using the pituitary forceps. We prepared the vertebral endplates at L4-5 bilaterally for the fusion by removing the soft tissues with the curettes. We then used the trial spacers to pick the appropriate sized interbody prosthesis. We prefilled his prosthesis with a combination of local morselized autograft bone that we obtained during the decompression as well as Kinnex bone graft extender. We inserted the prefilled prosthesis into the interspace at L4-5 bilaterally. There was a good snug fit of the prosthesis in the interspace. We then filled and the remainder of the intervertebral disc space with local morselized autograft bone and Kinnex. This completed the posterior lumbar interbody arthrodesis.  We now turned attention to the instrumentation. Under fluoroscopic guidance we cannulated the bilateral L4 and L5 pedicles with the bone probe. We then removed the bone  probe. We then tapped the pedicle with a 6.5 millimeter tap. We then  removed the tap. We probed inside the tapped pedicle with a ball probe to rule out cortical breaches. We then inserted a  6.5 millimeter pedicle screw into the L4 and L5 pedicles bilaterally under fluoroscopic guidance. We then palpated along the medial aspect of the pedicles to rule out cortical breaches. There were none. The nerve roots were not injured. We then connected the unilateral pedicle screws with a lordotic rod. We compressed the construct and secured the rod in place with the caps. We then tightened the caps appropriately. This completed the instrumentation from L4-5.  We now turned our attention to the posterior lateral arthrodesis at L4-5. We used the high-speed drill to decorticate the remainder of the facets, pars, transverse process at L4-5. We then applied a combination of local morselized autograft bone and Kinnex bone graft extender over these decorticated posterior lateral structures. This completed the posterior lateral arthrodesis.  We then obtained hemostasis using bipolar electrocautery. We irrigated the wound out with bacitracin solution. We inspected the thecal sac and nerve roots and noted they were well decompressed. We then removed the retractor. We placed vancomycin powder in the wound. We reapproximated patient's thoracolumbar fascia with interrupted #1 Vicryl suture. We reapproximated patient's subcutaneous tissue with interrupted 2-0 Vicryl suture. The reapproximated patient's skin with Steri-Strips and benzoin. The wound was then coated with bacitracin ointment. A sterile dressing was applied. The drapes were removed. The patient was subsequently returned to the supine position where they were extubated by the anesthesia team. He was then transported to the post anesthesia care unit in stable condition. All sponge instrument and needle counts were reportedly correct at the end of this case.

## 2017-02-05 NOTE — Transfer of Care (Signed)
Immediate Anesthesia Transfer of Care Note  Patient: Lindsey MeadMary L Quinnell  Procedure(s) Performed: POSTERIOR LUMBAR INTERBODY FUSION, INTERBODY PROSTHESIS, POSTERIOR INSTRUMENTATION LUMBAR FOUR- LUMBAR FIVE (N/A )  Patient Location: PACU  Anesthesia Type:General  Level of Consciousness: alert , drowsy and patient cooperative  Airway & Oxygen Therapy: Patient Spontanous Breathing and Patient connected to nasal cannula oxygen  Post-op Assessment: Report given to RN and Post -op Vital signs reviewed and stable  Post vital signs: Reviewed and stable  Last Vitals:  Vitals:   02/05/17 0645 02/05/17 0646  BP: (!) 151/79   Pulse: 84   Resp: 20   Temp:  36.9 C  SpO2: 98%     Last Pain: There were no vitals filed for this visit.       Complications: No apparent anesthesia complications

## 2017-02-05 NOTE — Evaluation (Signed)
Physical Therapy Evaluation Patient Details Name: Lindsey MeadMary L Hill MRN: [id number] DOB: 09/27/1950 Today's Date: 02/05/2017   History of Present Illness  Pt is a 66 y/o female s/p L4-5 PLIF. PMH includes HTN, asthma, and GERD.   Clinical Impression  Patient is s/p above surgery resulting in the deficits listed below (see PT Problem List). PTA, pt was independent with functional mobility. Upon eval, pt very limited by pain. Very guarded during gait and required min A for steadying and use of HHA and IV pole. Anticipate pt will progress well once pain controlled. Reports husband will be able to assist at d/c and will need DME below.  Patient will benefit from skilled PT to increase their independence and safety with mobility (while adhering to their precautions) to allow discharge to the venue listed below. Will continue to follow acutely.      Follow Up Recommendations No PT follow up;Supervision for mobility/OOB    Equipment Recommendations  Rolling walker with 5" wheels;3in1 (PT)    Recommendations for Other Services       Precautions / Restrictions Precautions Precautions: Back Precaution Booklet Issued: Yes (comment) Precaution Comments: Reviewed back precautions with pt.  Required Braces or Orthoses: Spinal Brace Spinal Brace: Lumbar corset;Applied in sitting position Restrictions Weight Bearing Restrictions: No      Mobility  Bed Mobility Overal bed mobility: Needs Assistance Bed Mobility: Rolling;Sidelying to Sit;Sit to Sidelying Rolling: Min assist Sidelying to sit: Min assist     Sit to sidelying: Min assist General bed mobility comments: Pt very guarded and requiring min A for rolling. Also required min A for trunk elevation to come up to sitting. Min A for LE lift assist upon return to sidelying. Verbal cues for use of log roll technique.   Transfers Overall transfer level: Needs assistance Equipment used: 1 person hand held assist Transfers: Sit to/from Stand Sit  to Stand: Min assist         General transfer comment: Min A for lift assist and steadying assist. Verbal cues to power through LEs.   Ambulation/Gait Ambulation/Gait assistance: Min assist Ambulation Distance (Feet): 75 Feet Assistive device: 1 person hand held assist(IV pole ) Gait Pattern/deviations: Step-through pattern;Decreased stride length Gait velocity: Decreased Gait velocity interpretation: Below normal speed for age/gender General Gait Details: Very guarded gait this session. Pt also reporting dizziness which limtied ambulation distance. Required min A and HHA/use of IV pole for steadying. Educated about use of RW at home to increase independence and safety with mobility. Educated about generalized walking program to perform at home.   Stairs            Wheelchair Mobility    Modified Rankin (Stroke Patients Only)       Balance Overall balance assessment: Needs assistance Sitting-balance support: Feet supported;No upper extremity supported Sitting balance-Leahy Scale: Fair     Standing balance support: Bilateral upper extremity supported;During functional activity Standing balance-Leahy Scale: Poor Standing balance comment: Reliant on UE support fir balance                             Pertinent Vitals/Pain Pain Assessment: Faces Faces Pain Scale: Hurts even more Pain Descriptors / Indicators: Aching;Operative site guarding Pain Intervention(s): Limited activity within patient's tolerance;Monitored during session;Repositioned;Patient requesting pain meds-RN notified    Home Living Family/patient expects to be discharged to:: Private residence Living Arrangements: Spouse/significant other Available Help at Discharge: Family;Available 24 hours/day Type of Home: House Home Access:  Stairs to enter Entrance Stairs-Rails: Doctor, general practiceight;Left Entrance Stairs-Number of Steps: 5 Home Layout: Multi-level;Able to live on main level with bedroom/bathroom Home  Equipment: None      Prior Function Level of Independence: Independent               Hand Dominance        Extremity/Trunk Assessment   Upper Extremity Assessment Upper Extremity Assessment: Defer to OT evaluation    Lower Extremity Assessment Lower Extremity Assessment: Generalized weakness    Cervical / Trunk Assessment Cervical / Trunk Assessment: Other exceptions Cervical / Trunk Exceptions: s/p PLIF   Communication   Communication: No difficulties  Cognition Arousal/Alertness: Awake/alert Behavior During Therapy: WFL for tasks assessed/performed Overall Cognitive Status: Within Functional Limits for tasks assessed                                        General Comments General comments (skin integrity, edema, etc.): Pt's husband present during session     Exercises     Assessment/Plan    PT Assessment Patient needs continued PT services  PT Problem List Decreased strength;Decreased balance;Decreased mobility;Decreased activity tolerance;Decreased knowledge of use of DME;Decreased knowledge of precautions;Pain       PT Treatment Interventions DME instruction;Gait training;Stair training;Functional mobility training;Therapeutic activities;Therapeutic exercise;Balance training;Neuromuscular re-education;Patient/family education    PT Goals (Current goals can be found in the Care Plan section)  Acute Rehab PT Goals Patient Stated Goal: to decrease pain  PT Goal Formulation: With patient Time For Goal Achievement: 02/12/17 Potential to Achieve Goals: Good    Frequency Min 5X/week   Barriers to discharge        Co-evaluation               AM-PAC PT "6 Clicks" Daily Activity  Outcome Measure Difficulty turning over in bed (including adjusting bedclothes, sheets and blankets)?: Unable Difficulty moving from lying on back to sitting on the side of the bed? : Unable Difficulty sitting down on and standing up from a chair with  arms (e.g., wheelchair, bedside commode, etc,.)?: Unable Help needed moving to and from a bed to chair (including a wheelchair)?: A Little Help needed walking in hospital room?: A Little Help needed climbing 3-5 steps with a railing? : A Lot 6 Click Score: 11    End of Session Equipment Utilized During Treatment: Gait belt;Back brace Activity Tolerance: Patient limited by pain Patient left: in bed;with call bell/phone within reach;with nursing/sitter in room;with family/visitor present Nurse Communication: Mobility status PT Visit Diagnosis: Other abnormalities of gait and mobility (R26.89);Pain Pain - part of body: (back )    Time: [id number] PT Time Calculation (min) (ACUTE ONLY): 21 min   Charges:   PT Evaluation $PT Eval Low Complexity: 1 Low     PT G Codes:        Gladys DammeBrittany Mikael Skoda, PT, DPT  Acute Rehabilitation Services  Pager: [phone]   Lehman PromBrittany S Zach Tietje 02/05/2017, 4:42 PM

## 2017-02-05 NOTE — Anesthesia Procedure Notes (Signed)
Procedure Name: Intubation Date/Time: 02/05/2017 7:40 AM Performed by: Waynard EdwardsSmith, Alexei Ey A, CRNA Pre-anesthesia Checklist: Patient identified, Emergency Drugs available, Suction available and Patient being monitored Patient Re-evaluated:Patient Re-evaluated prior to induction Oxygen Delivery Method: Circle system utilized Preoxygenation: Pre-oxygenation with 100% oxygen Induction Type: IV induction Ventilation: Mask ventilation without difficulty and Oral airway inserted - appropriate to patient size Laryngoscope Size: Hyacinth MeekerMiller and 2 Grade View: Grade I Tube type: Oral Tube size: 7.0 mm Number of attempts: 1 Airway Equipment and Method: Stylet Placement Confirmation: ETT inserted through vocal cords under direct vision,  positive ETCO2 and breath sounds checked- equal and bilateral Secured at: 21 cm Tube secured with: Tape Dental Injury: Teeth and Oropharynx as per pre-operative assessment

## 2017-02-05 NOTE — Anesthesia Preprocedure Evaluation (Signed)
Anesthesia Evaluation  Patient identified by MRN, date of birth, ID band Patient awake    Reviewed: Allergy & Precautions, NPO status , Patient's Chart, lab work & pertinent test results  Airway Mallampati: I  TM Distance: >3 FB Neck ROM: Full    Dental  (+) Teeth Intact   Pulmonary asthma ,    breath sounds clear to auscultation       Cardiovascular hypertension,  Rhythm:Regular Rate:Normal     Neuro/Psych  Headaches,    GI/Hepatic Neg liver ROS, GERD  ,  Endo/Other  negative endocrine ROS  Renal/GU negative Renal ROS     Musculoskeletal  (+) Arthritis ,   Abdominal   Peds  Hematology negative hematology ROS (+)   Anesthesia Other Findings   Reproductive/Obstetrics                             Anesthesia Physical Anesthesia Plan  ASA: II  Anesthesia Plan: General   Post-op Pain Management:    Induction: Intravenous  PONV Risk Score and Plan: 4 or greater and Ondansetron, Dexamethasone, Midazolam and Treatment may vary due to age or medical condition  Airway Management Planned: Oral ETT  Additional Equipment:   Intra-op Plan:   Post-operative Plan: Extubation in OR  Informed Consent: I have reviewed the patients History and Physical, chart, labs and discussed the procedure including the risks, benefits and alternatives for the proposed anesthesia with the patient or authorized representative who has indicated his/her understanding and acceptance.   Dental advisory given  Plan Discussed with: CRNA  Anesthesia Plan Comments:         Anesthesia Quick Evaluation

## 2017-02-05 NOTE — Anesthesia Postprocedure Evaluation (Signed)
Anesthesia Post Note  Patient: Lindsey Hill  Procedure(s) Performed: POSTERIOR LUMBAR INTERBODY FUSION, INTERBODY PROSTHESIS, POSTERIOR INSTRUMENTATION LUMBAR FOUR- LUMBAR FIVE (N/A )     Patient location during evaluation: PACU Anesthesia Type: General Level of consciousness: awake, sedated and oriented Pain management: pain level controlled Vital Signs Assessment: post-procedure vital signs reviewed and stable Respiratory status: spontaneous breathing, nonlabored ventilation, respiratory function stable and patient connected to nasal cannula oxygen Cardiovascular status: blood pressure returned to baseline and stable Postop Assessment: no apparent nausea or vomiting Anesthetic complications: no    Last Vitals:  Vitals:   02/05/17 1300 02/05/17 1342  BP: 118/77 120/72  Pulse: (!) 104   Resp: 12 18  Temp: 36.8 C 37.2 C  SpO2: 96% 94%    Last Pain:  Vitals:   02/05/17 1412  PainSc: 8                  Giovan Pinsky,JAMES TERRILL

## 2017-02-06 LAB — CBC
HEMATOCRIT: 29.9 % — AB (ref 36.0–46.0)
Hemoglobin: 9.3 g/dL — ABNORMAL LOW (ref 12.0–15.0)
MCH: 29.6 pg (ref 26.0–34.0)
MCHC: 31.1 g/dL (ref 30.0–36.0)
MCV: 95.2 fL (ref 78.0–100.0)
PLATELETS: 284 10*3/uL (ref 150–400)
RBC: 3.14 MIL/uL — AB (ref 3.87–5.11)
RDW: 14.7 % (ref 11.5–15.5)
WBC: 12.2 10*3/uL — AB (ref 4.0–10.5)

## 2017-02-06 LAB — BASIC METABOLIC PANEL
ANION GAP: 7 (ref 5–15)
BUN: 15 mg/dL (ref 6–20)
CALCIUM: 8.9 mg/dL (ref 8.9–10.3)
CO2: 25 mmol/L (ref 22–32)
Chloride: 106 mmol/L (ref 101–111)
Creatinine, Ser: 0.85 mg/dL (ref 0.44–1.00)
GLUCOSE: 110 mg/dL — AB (ref 65–99)
POTASSIUM: 4.5 mmol/L (ref 3.5–5.1)
Sodium: 138 mmol/L (ref 135–145)

## 2017-02-06 MED ORDER — CYCLOBENZAPRINE HCL 10 MG PO TABS: 10.0000 mg | ORAL_TABLET | Freq: Three times a day (TID) | ORAL | 1 refills | Status: DC | PRN

## 2017-02-06 MED ORDER — OXYCODONE-ACETAMINOPHEN 5-325 MG PO TABS: 1.0000 | ORAL_TABLET | ORAL | 0 refills | Status: DC | PRN

## 2017-02-06 MED ORDER — DOCUSATE SODIUM 100 MG PO CAPS: 100.0000 mg | ORAL_CAPSULE | Freq: Two times a day (BID) | ORAL | 0 refills | Status: DC

## 2017-02-06 MED ORDER — OXYCODONE-ACETAMINOPHEN 5-325 MG PO TABS
1.0000 | ORAL_TABLET | ORAL | Status: DC | PRN
  Administered 2017-02-06: 2 via ORAL
  Filled 2017-02-06: qty 2

## 2017-02-06 NOTE — Progress Notes (Signed)
Patient alert and oriented, mae's well, voiding adequate amount of urine, swallowing without difficulty,  C/o mild pain at time of discharge. Patient discharged home with family. Script and discharged instructions given to patient. Patient and family stated understanding of instructions given. Patient has an appointment with Dr. Lovell SheehanJenkins

## 2017-02-06 NOTE — Progress Notes (Signed)
Physical Therapy Treatment Patient Details Name: Lindsey MeadMary L Hill MRN: [id number] DOB: 08/08/1950 Today's Date: 02/06/2017    History of Present Illness Pt is a 66 y/o female s/p L4-5 PLIF. PMH includes HTN, asthma, and GERD.     PT Comments    Pt making steady progress with mobility and successfully completed stair training this session with min guard for safety. PT will continue to f/u with pt to ensure a safe d/c home.     Follow Up Recommendations  No PT follow up;Supervision for mobility/OOB     Equipment Recommendations  Rolling walker with 5" wheels;3in1 (PT);Other (comment)(YOUTH SIZED RW)    Recommendations for Other Services       Precautions / Restrictions Precautions Precautions: Back Precaution Booklet Issued: Yes (comment) Precaution Comments: Reviewed back precautions with pt.  Required Braces or Orthoses: Spinal Brace Spinal Brace: Lumbar corset;Applied in sitting position Restrictions Weight Bearing Restrictions: No    Mobility  Bed Mobility Overal bed mobility: Needs Assistance Bed Mobility: Rolling;Sidelying to Sit Rolling: Supervision Sidelying to sit: Supervision       General bed mobility comments: supervision for safety, good use of log roll technique  Transfers Overall transfer level: Needs assistance Equipment used: None Transfers: Sit to/from Stand Sit to Stand: Min guard         General transfer comment: for safety  Ambulation/Gait Ambulation/Gait assistance: Min assist;Min guard Ambulation Distance (Feet): 100 Feet(20' x1, 100' x1) Assistive device: 1 person hand held assist;Rolling walker (2 wheeled) Gait Pattern/deviations: Step-through pattern;Decreased stride length Gait velocity: Decreased Gait velocity interpretation: Below normal speed for age/gender General Gait Details: without use of an AD pt with modest instability requiring constant min A; with use of RW pt with improved balance with min guard for  safety   Stairs Stairs: Yes   Stair Management: Two rails;Step to pattern;Forwards Number of Stairs: 4 General stair comments: guarded, cautious, min guard for safety  Wheelchair Mobility    Modified Rankin (Stroke Patients Only)       Balance Overall balance assessment: Needs assistance Sitting-balance support: Feet supported;No upper extremity supported Sitting balance-Leahy Scale: Fair     Standing balance support: Bilateral upper extremity supported;During functional activity Standing balance-Leahy Scale: Poor Standing balance comment: Reliant on UE support                             Cognition Arousal/Alertness: Awake/alert Behavior During Therapy: WFL for tasks assessed/performed Overall Cognitive Status: Within Functional Limits for tasks assessed                                        Exercises      General Comments        Pertinent Vitals/Pain Pain Assessment: Faces Faces Pain Scale: Hurts little more Pain Location: back Pain Descriptors / Indicators: Aching;Operative site guarding Pain Intervention(s): Monitored during session;Repositioned    Home Living                      Prior Function            PT Goals (current goals can now be found in the care plan section) Acute Rehab PT Goals PT Goal Formulation: With patient Time For Goal Achievement: 02/12/17 Potential to Achieve Goals: Good Progress towards PT goals: Progressing toward goals    Frequency  Min 5X/week      PT Plan Current plan remains appropriate    Co-evaluation              AM-PAC PT "6 Clicks" Daily Activity  Outcome Measure  Difficulty turning over in bed (including adjusting bedclothes, sheets and blankets)?: None Difficulty moving from lying on back to sitting on the side of the bed? : None Difficulty sitting down on and standing up from a chair with arms (e.g., wheelchair, bedside commode, etc,.)?: A Little Help  needed moving to and from a bed to chair (including a wheelchair)?: A Little Help needed walking in hospital room?: A Little Help needed climbing 3-5 steps with a railing? : A Little 6 Click Score: 20    End of Session Equipment Utilized During Treatment: Gait belt;Back brace Activity Tolerance: Patient limited by fatigue;Patient limited by pain Patient left: in chair;with call bell/phone within reach Nurse Communication: Mobility status PT Visit Diagnosis: Other abnormalities of gait and mobility (R26.89);Pain Pain - part of body: (back)     Time: [id number] PT Time Calculation (min) (ACUTE ONLY): 14 min  Charges:  $Gait Training: 8-22 mins                    G Codes:       Dennis PortJennifer Lenus Trauger, [place]PT, [place]PT [phone]    Alessandra BevelsJennifer M Tremel Setters 02/06/2017, 10:17 AM

## 2017-02-06 NOTE — Discharge Summary (Signed)
  Physician Discharge Summary  Patient ID: Lindsey MeadMary L Hill MRN: [id number] DOB/AGE: 65/06/1950 65 y.o.  Admit date: 02/05/2017 Discharge date: 02/06/2017  Admission Diagnoses: L4-5 spondylolisthesis, facet arthropathy, spinal stenosis, lumbago, lumbar radiculopathy, neurogenic claudication  Discharge Diagnoses: The same Active Problems:   Spondylolisthesis of lumbar region   Discharged Condition: good  Hospital Course: I performed an L4-5 decompression, instrumentation, and fusion on the patient on 02/05/2017.  The surgery went well.  The patient's postoperative course was unremarkable.  On postoperative day #1 the patient requested discharge home.  She was given written and oral discharge instructions.  All her questions were answered.  Consults: Physical therapy Significant Diagnostic Studies: None Treatments: L4-5 decompression, and septation, and fusion. Discharge Exam: Blood pressure 114/79, pulse (!) 106, temperature 98.4 F (36.9 C), temperature source Oral, resp. rate 18, SpO2 97 %. The patient is alert and pleasant.  She looks well.  Her strength is normal in her lower extremities.  Disposition: Home  Discharge Instructions    Call MD for:  difficulty breathing, headache or visual disturbances   Complete by:  As directed    Call MD for:  extreme fatigue   Complete by:  As directed    Call MD for:  hives   Complete by:  As directed    Call MD for:  persistant dizziness or light-headedness   Complete by:  As directed    Call MD for:  persistant nausea and vomiting   Complete by:  As directed    Call MD for:  redness, tenderness, or signs of infection (pain, swelling, redness, odor or green/yellow discharge around incision site)   Complete by:  As directed    Call MD for:  severe uncontrolled pain   Complete by:  As directed    Call MD for:  temperature >100.4   Complete by:  As directed    Diet - low sodium heart healthy   Complete by:  As directed    Discharge  instructions   Complete by:  As directed    Call [phone] for a followup appointment. Take a stool softener while you are using pain medications.   Driving Restrictions   Complete by:  As directed    Do not drive for 2 weeks.   Increase activity slowly   Complete by:  As directed    Lifting restrictions   Complete by:  As directed    Do not lift more than 5 pounds. No excessive bending or twisting.   May shower / Bathe   Complete by:  As directed    He may shower after the pain she is removed 3 days after surgery. Leave the incision alone.   Remove dressing in 48 hours   Complete by:  As directed    Your stitches are under the scan and will dissolve by themselves. The Steri-Strips will fall off after you take a few showers. Do not rub back or pick at the wound, Leave the wound alone.        Signed: Cristi LoronJeffrey D Psalm Arman 02/06/2017, 11:43 AM

## 2017-02-06 NOTE — Plan of Care (Signed)
  Progressing Activity: Ability to avoid complications of mobility impairment will improve 02/06/2017 0618 - Progressing by Wille CelesteMadriaga-Acosta, Garet Hooton D, RN Ability to tolerate increased activity will improve 02/06/2017 0618 - Progressing by Wille CelesteMadriaga-Acosta, Lincoln Kleiner D, RN Will remain free from falls 02/06/2017 0618 - Progressing by Wille CelesteMadriaga-Acosta, Ashika Apuzzo D, RN Education: Ability to verbalize activity precautions or restrictions will improve 02/06/2017 0618 - Progressing by Wille CelesteMadriaga-Acosta, Ardyn Forge D, RN Knowledge of the prescribed therapeutic regimen will improve 02/06/2017 0618 - Progressing by Wille CelesteMadriaga-Acosta, Jamesyn Lindell D, RN Understanding of discharge needs will improve 02/06/2017 0618 - Progressing by Wille CelesteMadriaga-Acosta, Nickisha Hum D, RN Physical Regulation: Ability to maintain clinical measurements within normal limits will improve 02/06/2017 0618 - Progressing by Wille CelesteMadriaga-Acosta, Bless Lisenby D, RN Postoperative complications will be avoided or minimized 02/06/2017 0618 - Progressing by Wille CelesteMadriaga-Acosta, Mercedes Valeriano D, RN Diagnostic test results will improve 02/06/2017 0618 - Progressing by Wille CelesteMadriaga-Acosta, Josearmando Kuhnert D, RN Pain Management: Pain level will decrease 02/06/2017 0618 - Progressing by Wille CelesteMadriaga-Acosta, Bailee Thall D, RN Health Behavior/Discharge Planning: Identification of resources available to assist in meeting health care needs will improve 02/06/2017 0618 - Progressing by Wille CelesteMadriaga-Acosta, Shirlena Brinegar D, RN

## 2017-09-02 ENCOUNTER — Other Ambulatory Visit: Payer: Self-pay | Admitting: Neurosurgery

## 2017-09-02 ENCOUNTER — Telehealth: Payer: Self-pay | Admitting: Nurse Practitioner

## 2017-09-02 DIAGNOSIS — M5412 Radiculopathy, cervical region: Secondary | ICD-10-CM

## 2017-09-02 DIAGNOSIS — M545 Low back pain: Secondary | ICD-10-CM

## 2017-09-02 DIAGNOSIS — G8929 Other chronic pain: Secondary | ICD-10-CM

## 2017-09-02 NOTE — Telephone Encounter (Signed)
Phone call to patient to verify medication list and allergies for myelogram procedure. Pt informed she will need to stop Wellbutrin and Prozac 48hrs prior to myelogram appointment time. Pt verbalized understanding.

## 2017-09-21 ENCOUNTER — Ambulatory Visit
Admission: RE | Admit: 2017-09-21 | Discharge: 2017-09-21 | Disposition: A | Discharge status: Home / Self Care | Payer: Medicare Other | Source: Ambulatory Visit | Attending: Neurosurgery | Admitting: Neurosurgery

## 2017-09-21 VITALS — BP 114/69 | HR 69

## 2017-09-21 DIAGNOSIS — M5412 Radiculopathy, cervical region: Secondary | ICD-10-CM

## 2017-09-21 DIAGNOSIS — M545 Low back pain: Secondary | ICD-10-CM

## 2017-09-21 DIAGNOSIS — M4316 Spondylolisthesis, lumbar region: Secondary | ICD-10-CM

## 2017-09-21 DIAGNOSIS — G8929 Other chronic pain: Secondary | ICD-10-CM

## 2017-09-21 MED ORDER — IOPAMIDOL (ISOVUE-M 300) INJECTION 61%
10.0000 mL | Freq: Once | INTRAMUSCULAR | Status: AC | PRN
  Administered 2017-09-21: 10 mL via INTRATHECAL

## 2017-09-21 MED ORDER — HYDROMORPHONE HCL 1 MG/ML IJ SOLN
1.0000 mg | Freq: Once | INTRAMUSCULAR | Status: AC
  Administered 2017-09-21: 1 mg via INTRAMUSCULAR

## 2017-09-21 MED ORDER — DIAZEPAM 5 MG PO TABS
5.0000 mg | ORAL_TABLET | Freq: Once | ORAL | Status: AC
  Administered 2017-09-21: 5 mg via ORAL

## 2017-09-21 MED ORDER — HYDROXYZINE HCL 50 MG/ML IM SOLN
25.0000 mg | Freq: Once | INTRAMUSCULAR | Status: AC
  Administered 2017-09-21: 25 mg via INTRAMUSCULAR

## 2017-09-21 NOTE — Progress Notes (Signed)
Patient states she has been off Prozac and Wellbutrin for at least the past two days.  Donell SievertJeanne Alexcis Bicking, RN

## 2017-09-21 NOTE — Discharge Instructions (Signed)
Myelogram Discharge Instructions  1. Go home and rest quietly for the next 24 hours.  It is important to lie flat for the next 24 hours.  Get up only to go to the restroom.  You may lie in the bed or on a couch on your back, your stomach, your left side or your right side.  You may have one pillow under your head.  You may have pillows between your knees while you are on your side or under your knees while you are on your back.  2. DO NOT drive today.  Recline the seat as far back as it will go, while still wearing your seat belt, on the way home.  3. You may get up to go to the bathroom as needed.  You may sit up for 10 minutes to eat.  You may resume your normal diet and medications unless otherwise indicated.  Drink plenty of extra fluids today and tomorrow.  4. The incidence of a spinal headache with nausea and/or vomiting is about 5% (one in 20 patients).  If you develop a headache, lie flat and drink plenty of fluids until the headache goes away.  Caffeinated beverages may be helpful.  If you develop severe nausea and vomiting or a headache that does not go away with flat bed rest, call [phone].  5. You may resume normal activities after your 24 hours of bed rest is over; however, do not exert yourself strongly or do any heavy lifting tomorrow.  6. Call your physician for a follow-up appointment.    You may resume Prozac and Wellbutrin on Tuesday, September 22, 2017 after 1:00p.m.

## 2017-12-16 ENCOUNTER — Other Ambulatory Visit: Payer: Self-pay | Admitting: Neurosurgery

## 2017-12-23 ENCOUNTER — Other Ambulatory Visit: Payer: Self-pay | Admitting: Neurosurgery

## 2017-12-30 ENCOUNTER — Encounter (HOSPITAL_COMMUNITY)
Admission: RE | Admit: 2017-12-30 | Discharge: 2017-12-30 | Disposition: A | Discharge status: Home / Self Care | Payer: Medicare Other | Source: Ambulatory Visit | Attending: Neurosurgery | Admitting: Neurosurgery

## 2017-12-30 ENCOUNTER — Other Ambulatory Visit: Payer: Self-pay

## 2017-12-30 ENCOUNTER — Encounter (HOSPITAL_COMMUNITY): Payer: Self-pay

## 2017-12-30 DIAGNOSIS — J45909 Unspecified asthma, uncomplicated: Secondary | ICD-10-CM

## 2017-12-30 DIAGNOSIS — F419 Anxiety disorder, unspecified: Secondary | ICD-10-CM | POA: Insufficient documentation

## 2017-12-30 DIAGNOSIS — Z981 Arthrodesis status: Secondary | ICD-10-CM | POA: Insufficient documentation

## 2017-12-30 DIAGNOSIS — M4317 Spondylolisthesis, lumbosacral region: Secondary | ICD-10-CM

## 2017-12-30 DIAGNOSIS — R011 Cardiac murmur, unspecified: Secondary | ICD-10-CM

## 2017-12-30 DIAGNOSIS — Z01818 Encounter for other preprocedural examination: Secondary | ICD-10-CM | POA: Insufficient documentation

## 2017-12-30 DIAGNOSIS — I10 Essential (primary) hypertension: Secondary | ICD-10-CM

## 2017-12-30 DIAGNOSIS — K219 Gastro-esophageal reflux disease without esophagitis: Secondary | ICD-10-CM

## 2017-12-30 DIAGNOSIS — Z7982 Long term (current) use of aspirin: Secondary | ICD-10-CM

## 2017-12-30 DIAGNOSIS — Z79899 Other long term (current) drug therapy: Secondary | ICD-10-CM | POA: Insufficient documentation

## 2017-12-30 DIAGNOSIS — M1990 Unspecified osteoarthritis, unspecified site: Secondary | ICD-10-CM

## 2017-12-30 DIAGNOSIS — F329 Major depressive disorder, single episode, unspecified: Secondary | ICD-10-CM

## 2017-12-30 HISTORY — DX: Spondylosis without myelopathy or radiculopathy, lumbosacral region: M47.817

## 2017-12-30 HISTORY — DX: Presence of spectacles and contact lenses: Z97.3

## 2017-12-30 HISTORY — DX: Pneumonia, unspecified organism: J18.9

## 2017-12-30 LAB — SURGICAL PCR SCREEN
MRSA, PCR: NEGATIVE
Staphylococcus aureus: NEGATIVE

## 2017-12-30 LAB — BASIC METABOLIC PANEL
ANION GAP: 7 (ref 5–15)
BUN: 21 mg/dL (ref 8–23)
CO2: 23 mmol/L (ref 22–32)
Calcium: 9.9 mg/dL (ref 8.9–10.3)
Chloride: 109 mmol/L (ref 98–111)
Creatinine, Ser: 1.11 mg/dL — ABNORMAL HIGH (ref 0.44–1.00)
GFR calc Af Amer: 59 mL/min — ABNORMAL LOW (ref >60)
GFR, EST NON AFRICAN AMERICAN: 51 mL/min — AB (ref >60)
GLUCOSE: 105 mg/dL — AB (ref 70–99)
POTASSIUM: 3.9 mmol/L (ref 3.5–5.1)
Sodium: 139 mmol/L (ref 135–145)

## 2017-12-30 LAB — CBC
HEMATOCRIT: 37.9 % (ref 36.0–46.0)
HEMOGLOBIN: 11.7 g/dL — AB (ref 12.0–15.0)
MCH: 29.3 pg (ref 26.0–34.0)
MCHC: 30.9 g/dL (ref 30.0–36.0)
MCV: 94.8 fL (ref 80.0–100.0)
NRBC: 0 % (ref 0.0–0.2)
Platelets: 346 10*3/uL (ref 150–400)
RBC: 4 MIL/uL (ref 3.87–5.11)
RDW: 14.3 % (ref 11.5–15.5)
WBC: 6.6 10*3/uL (ref 4.0–10.5)

## 2017-12-30 LAB — TYPE AND SCREEN
ABO/RH(D): A POS
Antibody Screen: NEGATIVE

## 2017-12-30 NOTE — Progress Notes (Signed)
Pt denies SOB, chest pain, and being under the care of a cardiologist. PT denies having a stress test, echo and cardiac cath. Pt denies having a chest x ray within the last year. Pt PCP/Pulmonolgist is Dr. Maralyn Sago in [facility]; records requested.  Pt stated that last dose of Aspirin was " over a week ago." Pt denies recent labs. Pt verbalized understanding of all pre-op instructions. See anesthesia note.

## 2017-12-30 NOTE — Pre-Procedure Instructions (Signed)
   Lindsey Hill  12/30/2017     [facility] [phone] - [place], [place] - [address] AT [facility] OF [facility] [address] [place] [address] Phone: [phone] Fax: [phone]  [facility] - [place], [place] - [address] [facility] [address] [place] [address] Phone: [phone] Fax: [phone]   Your procedure is scheduled on Thursday, December 31, 2017  Report to [facility] Admitting at 8:00 A.M.  Call this number if you have problems the morning of surgery:  [phone]   Remember:  Do not eat or drink after midnight.  Take these medicines the morning of surgery with A SIP OF WATER: buPROPion (WELLBUTRIN XL), FLUoxetine (PROZAC), pantoprazole (PROTONIX)  If needed: loratadine (CLARITIN) for allergies, Hydrocodone for pain  Stop taking Aspirin, vitamins, fish oil, Melatonin and herbal medications. Do not take any NSAIDs ie: Ibuprofen, Advil, Naproxen (Aleve), Motrin, BC and Goody Powder; stop now.  Do not wear jewelry, make-up or nail polish.  Do not wear lotions, powders, or perfumes, or deodorant.  Do not shave 48 hours prior to surgery. .  Do not bring valuables to the hospital.  [facility] is not responsible for any belongings or valuables.  Contacts, dentures or bridgework may not be worn into surgery.  Leave your suitcase in the car.  After surgery it may be brought to your room. Special instructions: See " [facility]- preparing For Surgery " sheet. Please read over the following fact sheets that you were given. Pain Booklet, Coughing and Deep Breathing, MRSA Information and Surgical Site Infection Prevention

## 2017-12-30 NOTE — Progress Notes (Addendum)
Anesthesia Note:   Case:  [id number] Date/Time:  12/31/17 0948   Procedure:  POSTERIOR LUMBAR INTERBODY FUSION, INTERBODY PROSTHESIS, POSTERIOR INSTRUMENTATION AND FUSION LUMBAR 3- LUMBAR 4, lUMBAR 5- SACRAL 1; EXPLORE FUSION LUMBAR 4- LUMBAR 5 (N/A ) - POSTERIOR LUMBAR INTERBODY FUSION, INTERBODY PROSTHESIS, POSTERIOR INSTRUMENTATION AND FUSION LUMBAR 3- LUMBAR 4, lUMBAR 5- SACRAL 1; EXPLORE FUSION LUMBAR 4- LUMBAR 5   Anesthesia type:  General   Pre-op diagnosis:  SPONDYLOLISTHESIS, LUMBOSACRAL REGION   Location:  [facility] OR ROOM 19 / [facility] OR   Surgeon:  Tressie Stalker, MD      DISCUSSION: Patient is a 67 year old female scheduled for the above procedure.   History includes never smoker, HTN, asthma ("mild"), GERD, murmur ("had it forever"; reported no echo ever recommended). She is s/p L4-5 fusion 02/05/17.  PCP records requested, but are pending. Exam per Antionette Poles, PA-C while at PAT. Based on currently available information, I would anticipate that she can proceed if no acute changes.  Addendum 12/30/17: Pt was seen by Antionette Poles, PA-C during PAT appt due to pt reported hx of murmur. She reports she has been told she has a murmur for many years, has never had an echo. Says her PCP auscultates during exam and has not recommended any workup. She has good functional capacity, able to go up 2+ flight of stairs without significant SOB, no chest pain. She recently had PLIF 02/05/2017 with no complication. Auscultation today revels a soft systolic murmur. Based on her good functional capacity, lack of symptoms, and recent uncomplicated lumbar fusion I anticipate she can proceed as planned.  VS: BP (!) 143/77   Pulse 79   Temp 36.7 C   Resp 20   Ht 5\' 2"  (1.575 m)   Wt 63.2 kg   SpO2 97%   BMI 25.48 kg/m     PROVIDERS: Maralyn Sago, DO is PCP/pulmonologist in [place]. Records requested.    LABS: Labs reviewed: Acceptable for surgery. (all labs ordered are listed, but only abnormal  results are displayed)  Labs Reviewed  BASIC METABOLIC PANEL - Abnormal; Notable for the following components:      Result Value   Glucose, Bld 105 (*)    Creatinine, Ser 1.11 (*)    GFR calc non Af Amer 51 (*)    GFR calc Af Amer 59 (*)    All other components within normal limits  CBC - Abnormal; Notable for the following components:   Hemoglobin 11.7 (*)    All other components within normal limits  SURGICAL PCR SCREEN  TYPE AND SCREEN    IMAGES: CT with myelogram C/L spine 09/21/17: IMPRESSION: 1. Severe central and bilateral foraminal stenosis at C6-7, right greater than left. 2. Progressive moderate central and severe bilateral foraminal narrowing at C5-6. 3. Advanced facet hypertrophy at C7-T1 with mild bilateral foraminal stenosis. 4. Severe left and mild right foraminal stenosis at C3-4 is stable. 5. Moderate foraminal stenosis at C4-5 is stable, left greater than right. 6. Interval fusion at L4-5 with decompression of the central canal and foramina. 7. Progressive adjacent level disease at L5-S1 with a rightward disc protrusion resulting and progressive mild right subarticular and moderate right foraminal stenosis. This potentially impacts the right L5 and S1 nerve roots. 8. Stable mild left foraminal narrowing at L5-S1. 9. Progressive adjacent level disease at L3-4 with progressive mild subarticular narrowing bilaterally and similar appearance of moderate left and mild right foraminal narrowing. 10. Mild left subarticular and bilateral foraminal  stenosis at L2-3.   EKG:02/02/17 [facility]; scanned under Media tab, Correspondence, 02/05/17): NSR   CV: N/A  Past Medical History:  Diagnosis Date  . Anxiety   . Arthritis   . Asthma    "mild"  . Depression   . GERD (gastroesophageal reflux disease)   . Headache   . Heart murmur    "had it forever"   . Hypertension     Past Surgical History:  Procedure Laterality Date  . CESAREAN SECTION     . DG THUMB RIGHT HAND ([facility] HX)     joint replacement  . rectal fistula repair    . TUBAL LIGATION      MEDICATIONS: . aspirin EC 81 MG tablet  . buPROPion (WELLBUTRIN XL) 300 MG 24 hr tablet  . Cholecalciferol (VITAMIN D) 2000 units CAPS  . cyclobenzaprine (FLEXERIL) 10 MG tablet  . dicyclomine (BENTYL) 20 MG tablet  . diltiazem (CARTIA XT) 300 MG 24 hr capsule  . docusate sodium (COLACE) 100 MG capsule  . fenofibrate micronized (LOFIBRA) 134 MG capsule  . FLUoxetine (PROZAC) 40 MG capsule  . HYDROcodone-acetaminophen (NORCO) 10-325 MG tablet  . loratadine (CLARITIN) 10 MG tablet  . Melatonin 10 MG CAPS  . naproxen sodium (ALEVE) 220 MG tablet  . oxyCODONE-acetaminophen (PERCOCET/ROXICET) 5-325 MG tablet  . pantoprazole (PROTONIX) 40 MG tablet  . rosuvastatin (CRESTOR) 20 MG tablet   No current facility-administered medications for this encounter.     [facility] Short Stay Center/Anesthesiology Phone [phone] 12/30/2017 2:54 PM

## 2017-12-31 ENCOUNTER — Inpatient Hospital Stay (HOSPITAL_COMMUNITY): Payer: Medicare Other | Admitting: Anesthesiology

## 2017-12-31 ENCOUNTER — Inpatient Hospital Stay (HOSPITAL_COMMUNITY): Payer: Medicare Other

## 2017-12-31 ENCOUNTER — Inpatient Hospital Stay (HOSPITAL_COMMUNITY)
Admission: RE | Admit: 2017-12-31 | Discharge: 2018-01-04 | DRG: 460 | Disposition: A | Discharge status: Home Health | Payer: Medicare Other | Source: Ambulatory Visit | Attending: Neurosurgery | Admitting: Neurosurgery

## 2017-12-31 ENCOUNTER — Other Ambulatory Visit: Payer: Self-pay

## 2017-12-31 ENCOUNTER — Encounter (HOSPITAL_COMMUNITY): Payer: Self-pay

## 2017-12-31 ENCOUNTER — Inpatient Hospital Stay (HOSPITAL_COMMUNITY): Payer: Medicare Other | Admitting: Vascular Surgery

## 2017-12-31 ENCOUNTER — Inpatient Hospital Stay (HOSPITAL_COMMUNITY)
Admission: RE | Disposition: A | Discharge status: Home Health | Payer: Self-pay | Source: Ambulatory Visit | Attending: Neurosurgery

## 2017-12-31 DIAGNOSIS — M4727 Other spondylosis with radiculopathy, lumbosacral region: Secondary | ICD-10-CM | POA: Diagnosis present

## 2017-12-31 DIAGNOSIS — Z882 Allergy status to sulfonamides status: Secondary | ICD-10-CM

## 2017-12-31 DIAGNOSIS — Z419 Encounter for procedure for purposes other than remedying health state, unspecified: Secondary | ICD-10-CM

## 2017-12-31 DIAGNOSIS — Z7982 Long term (current) use of aspirin: Secondary | ICD-10-CM | POA: Diagnosis not present

## 2017-12-31 DIAGNOSIS — M960 Pseudarthrosis after fusion or arthrodesis: Secondary | ICD-10-CM | POA: Diagnosis present

## 2017-12-31 DIAGNOSIS — M5117 Intervertebral disc disorders with radiculopathy, lumbosacral region: Secondary | ICD-10-CM | POA: Diagnosis present

## 2017-12-31 DIAGNOSIS — Z79899 Other long term (current) drug therapy: Secondary | ICD-10-CM

## 2017-12-31 DIAGNOSIS — M48062 Spinal stenosis, lumbar region with neurogenic claudication: Secondary | ICD-10-CM | POA: Diagnosis present

## 2017-12-31 DIAGNOSIS — M4317 Spondylolisthesis, lumbosacral region: Secondary | ICD-10-CM | POA: Diagnosis present

## 2017-12-31 DIAGNOSIS — Z91013 Allergy to seafood: Secondary | ICD-10-CM | POA: Diagnosis not present

## 2017-12-31 DIAGNOSIS — F329 Major depressive disorder, single episode, unspecified: Secondary | ICD-10-CM | POA: Diagnosis present

## 2017-12-31 DIAGNOSIS — Y838 Other surgical procedures as the cause of abnormal reaction of the patient, or of later complication, without mention of misadventure at the time of the procedure: Secondary | ICD-10-CM | POA: Diagnosis present

## 2017-12-31 DIAGNOSIS — M4316 Spondylolisthesis, lumbar region: Secondary | ICD-10-CM

## 2017-12-31 DIAGNOSIS — Z87891 Personal history of nicotine dependence: Secondary | ICD-10-CM | POA: Diagnosis not present

## 2017-12-31 DIAGNOSIS — F419 Anxiety disorder, unspecified: Secondary | ICD-10-CM | POA: Diagnosis present

## 2017-12-31 DIAGNOSIS — K219 Gastro-esophageal reflux disease without esophagitis: Secondary | ICD-10-CM | POA: Diagnosis present

## 2017-12-31 DIAGNOSIS — G8929 Other chronic pain: Secondary | ICD-10-CM | POA: Diagnosis present

## 2017-12-31 DIAGNOSIS — Z96691 Finger-joint replacement of right hand: Secondary | ICD-10-CM | POA: Diagnosis present

## 2017-12-31 DIAGNOSIS — I10 Essential (primary) hypertension: Secondary | ICD-10-CM | POA: Diagnosis present

## 2017-12-31 DIAGNOSIS — Z888 Allergy status to other drugs, medicaments and biological substances status: Secondary | ICD-10-CM | POA: Diagnosis not present

## 2017-12-31 SURGERY — POSTERIOR LUMBAR FUSION 2 LEVEL
Anesthesia: General

## 2017-12-31 MED ORDER — PANTOPRAZOLE SODIUM 40 MG PO TBEC
40.0000 mg | DELAYED_RELEASE_TABLET | Freq: Every day | ORAL | Status: DC
  Administered 2018-01-01 – 2018-01-04 (×4): 40 mg via ORAL
  Filled 2017-12-31 (×4): qty 1

## 2017-12-31 MED ORDER — LIDOCAINE HCL (CARDIAC) PF 100 MG/5ML IV SOSY
PREFILLED_SYRINGE | INTRAVENOUS | Status: DC | PRN
  Administered 2017-12-31: 60 mg via INTRAVENOUS

## 2017-12-31 MED ORDER — EPHEDRINE 5 MG/ML INJ
INTRAVENOUS | Status: AC
  Filled 2017-12-31: qty 10

## 2017-12-31 MED ORDER — MIDAZOLAM HCL 5 MG/5ML IJ SOLN
INTRAMUSCULAR | Status: DC | PRN
  Administered 2017-12-31: 2 mg via INTRAVENOUS

## 2017-12-31 MED ORDER — THROMBIN (RECOMBINANT) 20000 UNITS EX SOLR
CUTANEOUS | Status: AC
  Filled 2017-12-31: qty 20000

## 2017-12-31 MED ORDER — THROMBIN 5000 UNITS EX SOLR
OROMUCOSAL | Status: DC | PRN
  Administered 2017-12-31 (×2): via TOPICAL

## 2017-12-31 MED ORDER — ACETAMINOPHEN 650 MG RE SUPP: 650.0000 mg | RECTAL | Status: DC | PRN

## 2017-12-31 MED ORDER — BUPIVACAINE-EPINEPHRINE 0.5% -1:200000 IJ SOLN
INTRAMUSCULAR | Status: AC
  Filled 2017-12-31: qty 1

## 2017-12-31 MED ORDER — ROSUVASTATIN CALCIUM 20 MG PO TABS
20.0000 mg | ORAL_TABLET | Freq: Every evening | ORAL | Status: DC
  Administered 2017-12-31 – 2018-01-03 (×4): 20 mg via ORAL
  Filled 2017-12-31 (×4): qty 1

## 2017-12-31 MED ORDER — THROMBIN 5000 UNITS EX SOLR
CUTANEOUS | Status: AC
  Filled 2017-12-31: qty 5000

## 2017-12-31 MED ORDER — OXYCODONE HCL 5 MG PO TABS
5.0000 mg | ORAL_TABLET | Freq: Once | ORAL | Status: AC | PRN
  Administered 2017-12-31: 5 mg via ORAL

## 2017-12-31 MED ORDER — ONDANSETRON HCL 4 MG PO TABS
4.0000 mg | ORAL_TABLET | Freq: Four times a day (QID) | ORAL | Status: DC | PRN
  Filled 2017-12-31: qty 1

## 2017-12-31 MED ORDER — MORPHINE SULFATE (PF) 4 MG/ML IV SOLN
4.0000 mg | INTRAVENOUS | Status: DC | PRN
  Administered 2018-01-01 – 2018-01-02 (×2): 4 mg via INTRAVENOUS
  Filled 2017-12-31 (×2): qty 1

## 2017-12-31 MED ORDER — BUPIVACAINE-EPINEPHRINE (PF) 0.25% -1:200000 IJ SOLN
INTRAMUSCULAR | Status: DC | PRN
  Administered 2017-12-31: 10 mL via PERINEURAL

## 2017-12-31 MED ORDER — PHENOL 1.4 % MT LIQD: 1.0000 | OROMUCOSAL | Status: DC | PRN

## 2017-12-31 MED ORDER — CHLORHEXIDINE GLUCONATE CLOTH 2 % EX PADS: 6.0000 | MEDICATED_PAD | Freq: Once | CUTANEOUS | Status: DC

## 2017-12-31 MED ORDER — SODIUM CHLORIDE 0.9 % IV SOLN
INTRAVENOUS | Status: DC
  Administered 2017-12-31 (×2): via INTRAVENOUS

## 2017-12-31 MED ORDER — PROPOFOL 10 MG/ML IV BOLUS
INTRAVENOUS | Status: AC
  Filled 2017-12-31: qty 20

## 2017-12-31 MED ORDER — MENTHOL 3 MG MT LOZG: 1.0000 | LOZENGE | OROMUCOSAL | Status: DC | PRN

## 2017-12-31 MED ORDER — FENTANYL CITRATE (PF) 100 MCG/2ML IJ SOLN
INTRAMUSCULAR | Status: DC | PRN
  Administered 2017-12-31: 150 ug via INTRAVENOUS
  Administered 2017-12-31: 25 ug via INTRAVENOUS
  Administered 2017-12-31 (×2): 50 ug via INTRAVENOUS
  Administered 2017-12-31: 25 ug via INTRAVENOUS

## 2017-12-31 MED ORDER — SODIUM CHLORIDE 0.9% FLUSH
3.0000 mL | Freq: Two times a day (BID) | INTRAVENOUS | Status: DC
  Administered 2018-01-02 – 2018-01-03 (×4): 3 mL via INTRAVENOUS

## 2017-12-31 MED ORDER — SUCCINYLCHOLINE CHLORIDE 200 MG/10ML IV SOSY
PREFILLED_SYRINGE | INTRAVENOUS | Status: AC
  Filled 2017-12-31: qty 10

## 2017-12-31 MED ORDER — ONDANSETRON HCL 4 MG/2ML IJ SOLN: 4.0000 mg | Freq: Once | INTRAMUSCULAR | Status: DC | PRN

## 2017-12-31 MED ORDER — ONDANSETRON HCL 4 MG/2ML IJ SOLN
INTRAMUSCULAR | Status: DC | PRN
  Administered 2017-12-31: 4 mg via INTRAVENOUS

## 2017-12-31 MED ORDER — DOCUSATE SODIUM 100 MG PO CAPS
100.0000 mg | ORAL_CAPSULE | Freq: Two times a day (BID) | ORAL | Status: DC
  Administered 2017-12-31 – 2018-01-04 (×8): 100 mg via ORAL
  Filled 2017-12-31 (×8): qty 1

## 2017-12-31 MED ORDER — OXYCODONE HCL 5 MG/5ML PO SOLN: 5.0000 mg | Freq: Once | ORAL | Status: AC | PRN

## 2017-12-31 MED ORDER — VANCOMYCIN HCL 1000 MG IV SOLR
INTRAVENOUS | Status: AC
  Filled 2017-12-31: qty 1000

## 2017-12-31 MED ORDER — SODIUM CHLORIDE 0.9 % IV SOLN
INTRAVENOUS | Status: DC | PRN
  Administered 2017-12-31: 11:00:00

## 2017-12-31 MED ORDER — VITAMIN D 1000 UNITS PO TABS
2000.0000 [IU] | ORAL_TABLET | Freq: Every day | ORAL | Status: DC
  Administered 2018-01-01 – 2018-01-04 (×4): 2000 [IU] via ORAL
  Filled 2017-12-31 (×4): qty 2

## 2017-12-31 MED ORDER — SUGAMMADEX SODIUM 200 MG/2ML IV SOLN
INTRAVENOUS | Status: DC | PRN
  Administered 2017-12-31: 200 mg via INTRAVENOUS

## 2017-12-31 MED ORDER — FENTANYL CITRATE (PF) 250 MCG/5ML IJ SOLN
INTRAMUSCULAR | Status: AC
  Filled 2017-12-31: qty 5

## 2017-12-31 MED ORDER — SODIUM CHLORIDE 0.9% FLUSH: 3.0000 mL | INTRAVENOUS | Status: DC | PRN

## 2017-12-31 MED ORDER — CYCLOBENZAPRINE HCL 10 MG PO TABS
ORAL_TABLET | ORAL | Status: AC
  Filled 2017-12-31: qty 1

## 2017-12-31 MED ORDER — DEXAMETHASONE SODIUM PHOSPHATE 10 MG/ML IJ SOLN
INTRAMUSCULAR | Status: AC
  Filled 2017-12-31: qty 2

## 2017-12-31 MED ORDER — LORATADINE 10 MG PO TABS: 10.0000 mg | ORAL_TABLET | Freq: Every day | ORAL | Status: DC | PRN

## 2017-12-31 MED ORDER — PROPOFOL 10 MG/ML IV BOLUS
INTRAVENOUS | Status: DC | PRN
  Administered 2017-12-31: 130 mg via INTRAVENOUS

## 2017-12-31 MED ORDER — ARTIFICIAL TEARS OPHTHALMIC OINT
TOPICAL_OINTMENT | OPHTHALMIC | Status: AC
  Filled 2017-12-31: qty 3.5

## 2017-12-31 MED ORDER — ONDANSETRON HCL 4 MG/2ML IJ SOLN
4.0000 mg | Freq: Four times a day (QID) | INTRAMUSCULAR | Status: DC | PRN
  Administered 2017-12-31: 4 mg via INTRAVENOUS
  Filled 2017-12-31: qty 2

## 2017-12-31 MED ORDER — DICYCLOMINE HCL 20 MG PO TABS: 20.0000 mg | ORAL_TABLET | Freq: Every day | ORAL | Status: DC | PRN

## 2017-12-31 MED ORDER — ACETAMINOPHEN 10 MG/ML IV SOLN
INTRAVENOUS | Status: AC
  Filled 2017-12-31: qty 100

## 2017-12-31 MED ORDER — FENTANYL CITRATE (PF) 100 MCG/2ML IJ SOLN
INTRAMUSCULAR | Status: AC
  Filled 2017-12-31: qty 2

## 2017-12-31 MED ORDER — FENTANYL CITRATE (PF) 100 MCG/2ML IJ SOLN
25.0000 ug | INTRAMUSCULAR | Status: DC | PRN
  Administered 2017-12-31: 50 ug via INTRAVENOUS
  Administered 2017-12-31 (×2): 25 ug via INTRAVENOUS
  Administered 2017-12-31: 50 ug via INTRAVENOUS

## 2017-12-31 MED ORDER — FLUOXETINE HCL 20 MG PO CAPS
40.0000 mg | ORAL_CAPSULE | Freq: Every day | ORAL | Status: DC
  Administered 2018-01-01 – 2018-01-04 (×4): 40 mg via ORAL
  Filled 2017-12-31 (×4): qty 2

## 2017-12-31 MED ORDER — LACTATED RINGERS IV SOLN
INTRAVENOUS | Status: DC | PRN
  Administered 2017-12-31 (×3): via INTRAVENOUS

## 2017-12-31 MED ORDER — ROCURONIUM BROMIDE 10 MG/ML (PF) SYRINGE
PREFILLED_SYRINGE | INTRAVENOUS | Status: DC | PRN
  Administered 2017-12-31: 10 mg via INTRAVENOUS
  Administered 2017-12-31: 50 mg via INTRAVENOUS
  Administered 2017-12-31 (×3): 10 mg via INTRAVENOUS
  Administered 2017-12-31: 20 mg via INTRAVENOUS
  Administered 2017-12-31: 10 mg via INTRAVENOUS

## 2017-12-31 MED ORDER — CEFAZOLIN SODIUM-DEXTROSE 2-4 GM/100ML-% IV SOLN
2.0000 g | INTRAVENOUS | Status: AC
  Administered 2017-12-31: 2 g via INTRAVENOUS
  Filled 2017-12-31: qty 100

## 2017-12-31 MED ORDER — OXYCODONE HCL 5 MG PO TABS: 5.0000 mg | ORAL_TABLET | ORAL | Status: DC | PRN

## 2017-12-31 MED ORDER — HYDROCODONE-ACETAMINOPHEN 10-325 MG PO TABS
1.0000 | ORAL_TABLET | ORAL | Status: DC | PRN
  Administered 2017-12-31 – 2018-01-04 (×5): 1 via ORAL
  Filled 2017-12-31 (×5): qty 1

## 2017-12-31 MED ORDER — BUPIVACAINE LIPOSOME 1.3 % IJ SUSP
20.0000 mL | INTRAMUSCULAR | Status: AC
  Administered 2017-12-31: 20 mL
  Filled 2017-12-31: qty 20

## 2017-12-31 MED ORDER — DEXAMETHASONE SODIUM PHOSPHATE 10 MG/ML IJ SOLN
INTRAMUSCULAR | Status: DC | PRN
  Administered 2017-12-31: 10 mg via INTRAVENOUS

## 2017-12-31 MED ORDER — FENOFIBRATE 160 MG PO TABS
160.0000 mg | ORAL_TABLET | Freq: Every day | ORAL | Status: DC
  Administered 2017-12-31 – 2018-01-04 (×5): 160 mg via ORAL
  Filled 2017-12-31 (×5): qty 1

## 2017-12-31 MED ORDER — BISACODYL 10 MG RE SUPP: 10.0000 mg | Freq: Every day | RECTAL | Status: DC | PRN

## 2017-12-31 MED ORDER — 0.9 % SODIUM CHLORIDE (POUR BTL) OPTIME
TOPICAL | Status: DC | PRN
  Administered 2017-12-31: 1000 mL

## 2017-12-31 MED ORDER — SODIUM CHLORIDE 0.9 % IV SOLN
INTRAVENOUS | Status: DC | PRN
  Administered 2017-12-31: 15:00:00 via INTRAVENOUS

## 2017-12-31 MED ORDER — CEFAZOLIN SODIUM-DEXTROSE 2-4 GM/100ML-% IV SOLN
2.0000 g | Freq: Three times a day (TID) | INTRAVENOUS | Status: AC
  Administered 2017-12-31 – 2018-01-01 (×2): 2 g via INTRAVENOUS
  Filled 2017-12-31 (×2): qty 100

## 2017-12-31 MED ORDER — BACITRACIN ZINC 500 UNIT/GM EX OINT
TOPICAL_OINTMENT | CUTANEOUS | Status: DC | PRN
  Administered 2017-12-31: 1 via TOPICAL

## 2017-12-31 MED ORDER — ROCURONIUM BROMIDE 50 MG/5ML IV SOSY
PREFILLED_SYRINGE | INTRAVENOUS | Status: AC
  Filled 2017-12-31: qty 20

## 2017-12-31 MED ORDER — ACETAMINOPHEN 10 MG/ML IV SOLN
1000.0000 mg | Freq: Once | INTRAVENOUS | Status: AC
  Administered 2017-12-31: 1000 mg via INTRAVENOUS

## 2017-12-31 MED ORDER — FENTANYL CITRATE (PF) 100 MCG/2ML IJ SOLN
25.0000 ug | INTRAMUSCULAR | Status: DC | PRN
  Administered 2017-12-31 (×3): 50 ug via INTRAVENOUS

## 2017-12-31 MED ORDER — THROMBIN 20000 UNITS EX SOLR
CUTANEOUS | Status: DC | PRN
  Administered 2017-12-31: 11:00:00 via TOPICAL

## 2017-12-31 MED ORDER — BACITRACIN ZINC 500 UNIT/GM EX OINT
TOPICAL_OINTMENT | CUTANEOUS | Status: AC
  Filled 2017-12-31: qty 28.35

## 2017-12-31 MED ORDER — BUPIVACAINE-EPINEPHRINE (PF) 0.25% -1:200000 IJ SOLN
INTRAMUSCULAR | Status: AC
  Filled 2017-12-31: qty 30

## 2017-12-31 MED ORDER — OXYCODONE HCL 5 MG PO TABS
ORAL_TABLET | ORAL | Status: AC
  Filled 2017-12-31: qty 1

## 2017-12-31 MED ORDER — ZOLPIDEM TARTRATE 5 MG PO TABS
5.0000 mg | ORAL_TABLET | Freq: Every evening | ORAL | Status: DC | PRN
  Administered 2018-01-01 – 2018-01-02 (×3): 5 mg via ORAL
  Filled 2017-12-31 (×3): qty 1

## 2017-12-31 MED ORDER — ACETAMINOPHEN 500 MG PO TABS
1000.0000 mg | ORAL_TABLET | Freq: Four times a day (QID) | ORAL | Status: AC
  Administered 2017-12-31 – 2018-01-01 (×4): 1000 mg via ORAL
  Filled 2017-12-31 (×4): qty 2

## 2017-12-31 MED ORDER — CYCLOBENZAPRINE HCL 10 MG PO TABS
10.0000 mg | ORAL_TABLET | Freq: Three times a day (TID) | ORAL | Status: DC | PRN
  Administered 2017-12-31 – 2018-01-04 (×8): 10 mg via ORAL
  Filled 2017-12-31 (×7): qty 1

## 2017-12-31 MED ORDER — MIDAZOLAM HCL 2 MG/2ML IJ SOLN
INTRAMUSCULAR | Status: AC
  Filled 2017-12-31: qty 2

## 2017-12-31 MED ORDER — SODIUM CHLORIDE 0.9 % IV SOLN
INTRAVENOUS | Status: DC | PRN
  Administered 2017-12-31: 40 ug/min via INTRAVENOUS

## 2017-12-31 MED ORDER — SODIUM CHLORIDE 0.9 % IV SOLN: 250.0000 mL | INTRAVENOUS | Status: DC

## 2017-12-31 MED ORDER — BUPROPION HCL ER (XL) 150 MG PO TB24
300.0000 mg | ORAL_TABLET | Freq: Every day | ORAL | Status: DC
  Administered 2018-01-01 – 2018-01-04 (×4): 300 mg via ORAL
  Filled 2017-12-31 (×4): qty 2

## 2017-12-31 MED ORDER — DILTIAZEM HCL ER COATED BEADS 180 MG PO CP24
300.0000 mg | ORAL_CAPSULE | Freq: Every evening | ORAL | Status: DC
  Administered 2017-12-31 – 2018-01-02 (×3): 300 mg via ORAL
  Filled 2017-12-31 (×4): qty 1

## 2017-12-31 MED ORDER — LIDOCAINE 2% (20 MG/ML) 5 ML SYRINGE
INTRAMUSCULAR | Status: AC
  Filled 2017-12-31: qty 10

## 2017-12-31 MED ORDER — ONDANSETRON HCL 4 MG/2ML IJ SOLN
INTRAMUSCULAR | Status: AC
  Filled 2017-12-31: qty 2

## 2017-12-31 MED ORDER — VANCOMYCIN HCL 1 G IV SOLR
INTRAVENOUS | Status: DC | PRN
  Administered 2017-12-31: 1000 mg

## 2017-12-31 MED ORDER — ACETAMINOPHEN 325 MG PO TABS
650.0000 mg | ORAL_TABLET | ORAL | Status: DC | PRN
  Administered 2018-01-02 – 2018-01-03 (×3): 650 mg via ORAL
  Filled 2017-12-31 (×3): qty 2

## 2017-12-31 MED ORDER — OXYCODONE HCL 5 MG PO TABS
10.0000 mg | ORAL_TABLET | ORAL | Status: DC | PRN
  Administered 2017-12-31 – 2018-01-04 (×20): 10 mg via ORAL
  Filled 2017-12-31 (×21): qty 2

## 2017-12-31 SURGICAL SUPPLY — 72 items
BAG DECANTER FOR FLEXI CONT (MISCELLANEOUS) ×3 IMPLANT
BASKET BONE COLLECTION (BASKET) ×3 IMPLANT
BENZOIN TINCTURE PRP APPL 2/3 (GAUZE/BANDAGES/DRESSINGS) ×3 IMPLANT
BLADE CLIPPER SURG (BLADE) IMPLANT
BUR MATCHSTICK NEURO 3.0 LAGG (BURR) ×3 IMPLANT
BUR PRECISION FLUTE 6.0 (BURR) ×3 IMPLANT
CANISTER SUCT 3000ML PPV (MISCELLANEOUS) ×3 IMPLANT
CAP REVERE LOCKING (Cap) ×24 IMPLANT
CARTRIDGE OIL MAESTRO DRILL (MISCELLANEOUS) ×1 IMPLANT
CLOSURE WOUND 1/2 X4 (GAUZE/BANDAGES/DRESSINGS) ×1
CONT SPEC 4OZ CLIKSEAL STRL BL (MISCELLANEOUS) ×3 IMPLANT
COVER BACK TABLE 60X90IN (DRAPES) ×3 IMPLANT
COVER WAND RF STERILE (DRAPES) ×3 IMPLANT
DECANTER SPIKE VIAL GLASS SM (MISCELLANEOUS) ×3 IMPLANT
DERMABOND ADVANCED (GAUZE/BANDAGES/DRESSINGS) ×2
DERMABOND ADVANCED .7 DNX12 (GAUZE/BANDAGES/DRESSINGS) ×1 IMPLANT
DIFFUSER DRILL AIR PNEUMATIC (MISCELLANEOUS) ×3 IMPLANT
DRAPE C-ARM 42X72 X-RAY (DRAPES) ×6 IMPLANT
DRAPE HALF SHEET 40X57 (DRAPES) ×6 IMPLANT
DRAPE LAPAROTOMY 100X72X124 (DRAPES) ×3 IMPLANT
DRAPE SURG 17X23 STRL (DRAPES) ×12 IMPLANT
ELECT BLADE 4.0 EZ CLEAN MEGAD (MISCELLANEOUS) ×3
ELECT REM PT RETURN 9FT ADLT (ELECTROSURGICAL) ×3
ELECTRODE BLDE 4.0 EZ CLN MEGD (MISCELLANEOUS) ×1 IMPLANT
ELECTRODE REM PT RTRN 9FT ADLT (ELECTROSURGICAL) ×1 IMPLANT
GAUZE 4X4 16PLY RFD (DISPOSABLE) ×3 IMPLANT
GAUZE SPONGE 4X4 12PLY STRL (GAUZE/BANDAGES/DRESSINGS) ×3 IMPLANT
GLOVE BIO SURGEON STRL SZ8 (GLOVE) ×6 IMPLANT
GLOVE BIO SURGEON STRL SZ8.5 (GLOVE) ×6 IMPLANT
GLOVE BIOGEL PI IND STRL 7.0 (GLOVE) ×1 IMPLANT
GLOVE BIOGEL PI IND STRL 7.5 (GLOVE) ×4 IMPLANT
GLOVE BIOGEL PI INDICATOR 7.0 (GLOVE) ×2
GLOVE BIOGEL PI INDICATOR 7.5 (GLOVE) ×8
GLOVE EXAM NITRILE LRG STRL (GLOVE) IMPLANT
GLOVE EXAM NITRILE XL STR (GLOVE) IMPLANT
GLOVE EXAM NITRILE XS STR PU (GLOVE) IMPLANT
GLOVE OPTIFIT SS 6.5 STRL BRWN (GLOVE) ×6 IMPLANT
GLOVE SURG SS PI 7.0 STRL IVOR (GLOVE) ×6 IMPLANT
GOWN STRL REUS W/ TWL LRG LVL3 (GOWN DISPOSABLE) ×2 IMPLANT
GOWN STRL REUS W/ TWL XL LVL3 (GOWN DISPOSABLE) ×4 IMPLANT
GOWN STRL REUS W/TWL 2XL LVL3 (GOWN DISPOSABLE) IMPLANT
GOWN STRL REUS W/TWL LRG LVL3 (GOWN DISPOSABLE) ×4
GOWN STRL REUS W/TWL XL LVL3 (GOWN DISPOSABLE) ×8
HEMOSTAT POWDER KIT SURGIFOAM (HEMOSTASIS) ×6 IMPLANT
KIT BASIN OR (CUSTOM PROCEDURE TRAY) ×3 IMPLANT
KIT INFUSE SMALL (Orthopedic Implant) ×3 IMPLANT
KIT TURNOVER KIT B (KITS) ×3 IMPLANT
MILL MEDIUM DISP (BLADE) ×3 IMPLANT
NEEDLE HYPO 21X1.5 SAFETY (NEEDLE) ×3 IMPLANT
NEEDLE HYPO 22GX1.5 SAFETY (NEEDLE) ×3 IMPLANT
NS IRRIG 1000ML POUR BTL (IV SOLUTION) ×3 IMPLANT
OIL CARTRIDGE MAESTRO DRILL (MISCELLANEOUS) ×3
PACK LAMINECTOMY NEURO (CUSTOM PROCEDURE TRAY) ×3 IMPLANT
PAD ARMBOARD 7.5X6 YLW CONV (MISCELLANEOUS) ×9 IMPLANT
PATTIES SURGICAL .5 X1 (DISPOSABLE) ×3 IMPLANT
ROD REVERE 6.35 CURVED 85MM (Rod) ×6 IMPLANT
SCREW 7.5X50MM (Screw) ×6 IMPLANT
SCREW REVERE 6.35 7.5X40 (Screw) ×6 IMPLANT
SPACER ALTERA 10X31 8-12MM-8 (Spacer) ×6 IMPLANT
SPONGE LAP 4X18 RFD (DISPOSABLE) IMPLANT
SPONGE NEURO XRAY DETECT 1X3 (DISPOSABLE) IMPLANT
SPONGE SURGIFOAM ABS GEL 100 (HEMOSTASIS) ×3 IMPLANT
STRIP BIOACTIVE 20CC 25X100X8 (Miscellaneous) ×3 IMPLANT
STRIP CLOSURE SKIN 1/2X4 (GAUZE/BANDAGES/DRESSINGS) ×2 IMPLANT
SUT VIC AB 1 CT1 18XBRD ANBCTR (SUTURE) ×2 IMPLANT
SUT VIC AB 1 CT1 8-18 (SUTURE) ×4
SUT VIC AB 2-0 CP2 18 (SUTURE) ×6 IMPLANT
SYR 20CC LL (SYRINGE) ×3 IMPLANT
TOWEL GREEN STERILE (TOWEL DISPOSABLE) ×3 IMPLANT
TOWEL GREEN STERILE FF (TOWEL DISPOSABLE) ×3 IMPLANT
TRAY FOLEY MTR SLVR 16FR STAT (SET/KITS/TRAYS/PACK) ×3 IMPLANT
WATER STERILE IRR 1000ML POUR (IV SOLUTION) ×3 IMPLANT

## 2017-12-31 NOTE — Anesthesia Postprocedure Evaluation (Signed)
Anesthesia Post Note  Patient: [name]  Procedure(s) Performed: POSTERIOR LUMBAR INTERBODY FUSION, INTERBODY PROSTHESIS, POSTERIOR INSTRUMENTATION AND FUSION LUMBAR THREE- LUMBAR FOUR, lUMBAR FIVE- SACRAL ONE; EXPLORE FUSION LUMBAR FOUR- LUMBAR FIVE (N/A )     Patient location during evaluation: PACU Anesthesia Type: General Level of consciousness: awake and alert Pain management: pain level controlled Vital Signs Assessment: post-procedure vital signs reviewed and stable Respiratory status: spontaneous breathing, nonlabored ventilation, respiratory function stable and patient connected to nasal cannula oxygen Cardiovascular status: blood pressure returned to baseline and stable Postop Assessment: no apparent nausea or vomiting Anesthetic complications: no    Last Vitals:  Vitals:   12/31/17 1536 12/31/17 1540  BP:  (!) 141/80  Pulse: 81 83  Resp: (!) 9 12  Temp:    SpO2: 97% 99%    Last Pain:  Vitals:   12/31/17 1536  TempSrc:   PainSc: 8     LLE Motor Response: Purposeful movement (12/31/17 1540) LLE Sensation: Full sensation (12/31/17 1540) RLE Motor Response: Purposeful movement (12/31/17 1540) RLE Sensation: Full sensation (12/31/17 1540)      Lindsey Hill

## 2017-12-31 NOTE — Anesthesia Procedure Notes (Signed)
Procedure Name: Intubation Date/Time: 12/31/2017 10:27 AM Performed by: Carney Living, CRNA Pre-anesthesia Checklist: Emergency Drugs available, Patient identified, Suction available, Patient being monitored and Timeout performed Patient Re-evaluated:Patient Re-evaluated prior to induction Oxygen Delivery Method: Circle system utilized Preoxygenation: Pre-oxygenation with 100% oxygen Induction Type: IV induction Ventilation: Mask ventilation without difficulty and Oral airway inserted - appropriate to patient size Laryngoscope Size: Mac and 4 Grade View: Grade I Tube type: Oral Tube size: 7.0 mm Number of attempts: 2 Airway Equipment and Method: Stylet Placement Confirmation: ETT inserted through vocal cords under direct vision,  positive ETCO2 and breath sounds checked- equal and bilateral Secured at: 22 cm Tube secured with: Tape Dental Injury: Teeth and Oropharynx as per pre-operative assessment

## 2017-12-31 NOTE — Op Note (Signed)
Brief history: The patient is a 67 year old white female on whom I previously performed an L4-5 decompression, instrumentation and fusion.  He has had persistent back and leg pain.  She has failed medical management.  She was worked up with a lumbar myelo CT which demonstrated an L5-S1 spinal listhesis with L3-4 multifactorial spinal stenosis.  I discussed the various treatment options with the patient including surgery.  She has weighed the risks, benefits and alternatives to surgery and decided proceed with an exploration of her lumbar fusion with L3-4 and L5-S1 decompression, instrumentation and fusion.  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 Etc. requiring a wide decompression of the nerve roots.);  L3-4 and L5-S1 transforaminal lumbar interbody fusion with local morselized autograft bone, bone morphogenic protein soaked collagen sponges, and Kinnex graft extender; insertion of interbody prosthesis at L3-4 and L5-S1 (globus peek expandable interbody prosthesis); posterior segmental instrumentation from L3-S1 with globus titanium pedicle screws and rods; posterior lateral arthrodesis at L3-4, L4-5 and L5-S1 with local morselized autograft bone, bone morphogenic protein soaked collagen sponges, and Kinnex bone graft extender; exploration of lumbar fusion  Surgeon: Dr. Delma Officer  Asst.: Dr. Coletta Memos  Anesthesia: Gen. endotracheal  Estimated blood loss: 400 cc  Drains:  None  Complications: None  Description of procedure: The patient was brought to the operating room by the anesthesia team. General endotracheal anesthesia was induced. The patient was turned to the prone position on the Wilson frame. The patient's lumbosacral region was then prepared with Betadine scrub and Betadine solution. Sterile drapes were applied.  I then injected the area to be incised with Marcaine with epinephrine solution. I then used the scalpel to make a linear midline incision over the L3-4, L4-5 and L5-S1 interspace incising through the old surgical scar. I then used electrocautery to perform a bilateral subperiosteal dissection exposing the spinous process and lamina of L3-S1 and exposing the old hardware at L4-5.  We then inserted the Verstrac retractor to provide exposure.  I explored the fusion by removing the caps from the screws at L4 and L5 bilaterally, then removing the rod.  The screws do not appear loose.  I did not see a good posterior lateral arthrodesis and felt there was a pseudoarthrosis at L4-5.  I began the decompression by using the high speed drill to perform laminotomies at L3-4 and L5-S1 bilaterally. We then used the Kerrison punches to widen the laminotomy and removed the ligamentum flavum at L3-4 and L5-S1 bilaterally. We used the Kerrison punches to remove the medial facets at L3-4 and L5-S1 bilaterally. We performed wide foraminotomies about the bilateral L3, L4, L5 and S1 nerve roots completing the decompression.  We now turned our attention to the posterior lumbar interbody fusion. I used a scalpel to incise the intervertebral disc at L3-4 and L5-S1 bilaterally. I then performed a partial intervertebral discectomy at L3-4 and L5-S1 bilaterally using the pituitary forceps. We prepared the vertebral endplates at L3-4 and L5-S1 bilaterally for the fusion by removing the soft tissues with the curettes. We then used the trial spacers to pick  the appropriate sized  interbody prosthesis. We prefilled his prosthesis with a combination of local morselized autograft bone that we obtained during the decompression as well as Kinnex bone graft extender. We inserted the prefilled prosthesis into the interspace at L3-4 and L5-S1, we then turned and expanded the prosthesis. There was a good snug fit of the prosthesis in the interspace. We then filled and the remainder of the intervertebral disc space with local morselized autograft bone and Kinnex. This completed the posterior lumbar interbody arthrodesis.  We now turned attention to the instrumentation. Under fluoroscopic guidance we cannulated the bilateral L3 and S1 pedicles with the bone probe. We then removed the bone probe. We then tapped the pedicle with a 6.5 millimeter tap. We then removed the tap. We probed inside the tapped pedicle with a ball probe to rule out cortical breaches. We then inserted a 7.5 x 50 and 40 millimeter pedicle screw into the L3 and S1 pedicles bilaterally under fluoroscopic guidance. We then palpated along the medial aspect of the pedicles to rule out cortical breaches. There were none. The nerve roots were not injured. We then connected the unilateral pedicle screws with a lordotic rod. We compressed the construct and secured the rod in place with the caps. We then tightened the caps appropriately. This completed the instrumentation from 3 to S1 bilaterally.  We now turned our attention to the posterior lateral arthrodesis at L3-4, L4-5 and L5-S1. We used the high-speed drill to decorticate the remainder of the facets, pars, transverse process at L3-4, L4-5 and L5-S1. We then applied a combination of local morselized autograft bone and Kinnex bone graft extender over these decorticated posterior lateral structures. This completed the posterior lateral arthrodesis.  We then obtained hemostasis using bipolar electrocautery. We irrigated the wound out with bacitracin solution. We inspected the  thecal sac and nerve roots and noted they were well decompressed. We then removed the retractor. We placed vancomycin powder in the wound.  We injected Exparel . We reapproximated patient's thoracolumbar fascia with interrupted #1 Vicryl suture. We reapproximated patient's subcutaneous tissue with interrupted 2-0 Vicryl suture. The reapproximated patient's skin with Steri-Strips and benzoin. The wound was then coated with bacitracin ointment. A sterile dressing was applied. The drapes were removed. The patient was subsequently returned to the supine position where they were extubated by the anesthesia team. He was then transported to the post anesthesia care unit in stable condition. All sponge instrument and needle counts were reportedly correct at the end of this case.

## 2017-12-31 NOTE — Transfer of Care (Signed)
Immediate Anesthesia Transfer of Care Note  Patient: Lindsey Hill  Procedure(s) Performed: POSTERIOR LUMBAR INTERBODY FUSION, INTERBODY PROSTHESIS, POSTERIOR INSTRUMENTATION AND FUSION LUMBAR THREE- LUMBAR FOUR, lUMBAR FIVE- SACRAL ONE; EXPLORE FUSION LUMBAR FOUR- LUMBAR FIVE (N/A )  Patient Location: PACU  Anesthesia Type:General  Level of Consciousness: awake, alert , oriented and patient cooperative  Airway & Oxygen Therapy: Patient Spontanous Breathing and Patient connected to nasal cannula oxygen  Post-op Assessment: Report given to RN, Post -op Vital signs reviewed and stable and Patient moving all extremities X 4  Post vital signs: Reviewed and stable  Last Vitals:  Vitals Value Taken Time  BP 139/78 12/31/2017  3:10 PM  Temp    Pulse 88 12/31/2017  3:16 PM  Resp 9 12/31/2017  3:16 PM  SpO2 98 % 12/31/2017  3:16 PM  Vitals shown include unvalidated device data.  Last Pain:  Vitals:   12/31/17 0836  TempSrc:   PainSc: 0-No pain         Complications: No apparent anesthesia complications

## 2017-12-31 NOTE — Progress Notes (Signed)
Husband has asked for the following for  Discharge needs with a hand written copy in chart:  Fax home health orders and surgery report to to Dr Jeanice Lim and copies to of each to take with them? Dr Maralyn Sago  [address] [place], [place] [address]  Phone [phone] Fax [phone]    Also, pharmacy they use is [facility] [address]  [place]  [place] [address]  Phone [phone] Fax [phone]

## 2017-12-31 NOTE — H&P (Signed)
Subjective: The patient is a 67 year old white female on whom I performed an L4-5 decompression, instrumentation and fusion.  She is had chronic back and leg pain.  She has failed medical management.  She was worked up with a lumbar MRI and myelo CT which demonstrated she had degenerative disc disease and stenosis at L3-4 and L5-S1.  I discussed the various treatment options.  She has decided to proceed with surgery after weighing the risks, benefits and alternatives.  Past Medical History:  Diagnosis Date  . Anxiety   . Arthritis   . Asthma    "mild"  . Depression   . GERD (gastroesophageal reflux disease)   . Headache   . Heart murmur    "had it forever"   . Hypertension   . Pneumonia   . Spondylosis of lumbosacral region   . Wears glasses     Past Surgical History:  Procedure Laterality Date  . CESAREAN SECTION    . COLONOSCOPY    . DG THUMB RIGHT HAND ([facility] HX)     joint replacement  . rectal fistula repair    . TONSILLECTOMY    . TUBAL LIGATION      Allergies  Allergen Reactions  . Phoslo [Calcium Acetate] Other (See Comments)    Body aches   . Shellfish Allergy Hives  . Sulfa Antibiotics Hives  . Mobic [Meloxicam] Nausea Only    Social History   Tobacco Use  . Smoking status: Former Games developer  . Smokeless tobacco: Never Used  . Tobacco comment: quit smoking cigaettes  aroun the year 2000  Substance Use Topics  . Alcohol use: Yes    Alcohol/week: 3.0 standard drinks    Types: 3 Glasses of wine per week    Comment: weekly     Family History  Problem Relation Age of Onset  . Renal Disease Mother   . Heart disease Father    Prior to Admission medications   Medication Sig Start Date End Date Taking? Authorizing Provider  aspirin EC 81 MG tablet Take 81 mg daily by mouth.   Yes [provider]  buPROPion (WELLBUTRIN XL) 300 MG 24 hr tablet Take 300 mg by mouth daily.   Yes [provider]  Cholecalciferol (VITAMIN D) 2000 units CAPS Take 2,000  Units daily by mouth.   Yes [provider]  dicyclomine (BENTYL) 20 MG tablet Take 20 mg daily as needed by mouth (upset stomach).    Yes [provider]  diltiazem (CARTIA XT) 300 MG 24 hr capsule Take 300 mg every evening by mouth.    Yes [provider]  fenofibrate micronized (LOFIBRA) 134 MG capsule Take 134 mg every evening by mouth.    Yes [provider]  FLUoxetine (PROZAC) 40 MG capsule Take 40 mg by mouth daily.   Yes [provider]  HYDROcodone-acetaminophen (NORCO) 10-325 MG tablet Take 1 tablet by mouth 3 (three) times daily.    Yes [provider]  loratadine (CLARITIN) 10 MG tablet Take 10 mg daily as needed by mouth for allergies or rhinitis.   Yes [provider]  Melatonin 10 MG CAPS Take 10 mg at bedtime by mouth.    Yes [provider]  naproxen sodium (ALEVE) 220 MG tablet Take 220 mg by mouth daily as needed (pain).   Yes [provider]  pantoprazole (PROTONIX) 40 MG tablet Take 40 mg by mouth daily.    Yes [provider]  rosuvastatin (CRESTOR) 20 MG tablet Take  20 mg every evening by mouth.    Yes [provider]  cyclobenzaprine (FLEXERIL) 10 MG tablet Take 1 tablet (10 mg total) by mouth 3 (three) times daily as needed for muscle spasms. Patient not taking: Reported on 09/02/2017 02/06/17   Tressie Stalker, MD  docusate sodium (COLACE) 100 MG capsule Take 1 capsule (100 mg total) by mouth 2 (two) times daily. Patient not taking: Reported on 09/02/2017 02/06/17   Tressie Stalker, MD  oxyCODONE-acetaminophen (PERCOCET/ROXICET) 5-325 MG tablet Take 1-2 tablets by mouth every 4 (four) hours as needed for moderate pain. Patient not taking: Reported on 09/02/2017 02/06/17   Tressie Stalker, MD     Review of Systems  Positive ROS: As above  All other systems have been reviewed and were otherwise negative with the exception of those mentioned in the HPI and as  above.  Objective: Vital signs in last 24 hours: Temp:  [98.1 F (36.7 C)-98.2 F (36.8 C)] 98.2 F (36.8 C) (10/24 0811) Pulse Rate:  [66-79] 66 (10/24 0811) Resp:  [18-20] 18 (10/24 0811) BP: (143-153)/(77-78) 153/78 (10/24 0811) SpO2:  [97 %] 97 % (10/24 0811) Weight:  [63.2 kg] 63.2 kg (10/24 0811) Estimated body mass index is 25.48 kg/m as calculated from the following:   Height as of this encounter: 5\' 2"  (1.575 m).   Weight as of this encounter: 63.2 kg.   General Appearance: Alert Head: Normocephalic, without obvious abnormality, atraumatic Eyes: PERRL, conjunctiva/corneas clear, EOM's intact,    Ears: Normal  Throat: Normal  Neck: Supple, Back: The patient's lumbar incision is well-healed. Lungs: Clear to auscultation bilaterally, respirations unlabored Heart: Regular rate and rhythm, no murmur, rub or gallop Abdomen: Soft, non-tender Extremities: Extremities normal, atraumatic, no cyanosis or edema Skin: unremarkable  NEUROLOGIC:   Mental status: alert and oriented,Motor Exam - grossly normal Sensory Exam - grossly normal Reflexes:  Coordination - grossly normal Gait - grossly normal Balance - grossly normal Cranial Nerves: I: smell Not tested  II: visual acuity  OS: Normal  OD: Normal   II: visual fields Full to confrontation  II: pupils Equal, round, reactive to light  III,VII: ptosis None  III,IV,VI: extraocular muscles  Full ROM  V: mastication Normal  V: facial light touch sensation  Normal  V,VII: corneal reflex  Present  VII: facial muscle function - upper  Normal  VII: facial muscle function - lower Normal  VIII: hearing Not tested  IX: soft palate elevation  Normal  IX,X: gag reflex Present  XI: trapezius strength  5/5  XI: sternocleidomastoid strength 5/5  XI: neck flexion strength  5/5  XII: tongue strength  Normal    Data Review Lab Results  Component Value Date   WBC 6.6 12/30/2017   HGB 11.7 (L) 12/30/2017   HCT 37.9 12/30/2017    MCV 94.8 12/30/2017   PLT 346 12/30/2017   Lab Results  Component Value Date   NA 139 12/30/2017   K 3.9 12/30/2017   CL 109 12/30/2017   CO2 23 12/30/2017   BUN 21 12/30/2017   CREATININE 1.11 (H) 12/30/2017   GLUCOSE 105 (H) 12/30/2017   No results found for: INR, PROTIME  Assessment/Plan: L3-4 and L5-S1 degenerative disease, herniated disc, lumbago, lumbar radiculopathy, spinal stenosis: I have discussed the situation with the patient and her husband.  I reviewed her imaging studies with him and pointed out the abnormalities.  We have discussed the various treatment options including surgery.  I have described the surgical treatment option of  an 3 4 and L5-S1 decompression, instrumentation, and fusion.  I have shown her surgical models.  I have given her surgical pamphlet.  We have discussed the risks, benefits, alternatives, expected postoperative course, and likelihood of achieving our goals with surgery.  I have answered all her questions.  She has decided to proceed with surgery.   Cristi Loron 12/31/2017 10:04 AM

## 2017-12-31 NOTE — Anesthesia Preprocedure Evaluation (Addendum)
Anesthesia Evaluation  Patient identified by MRN, date of birth, ID band Patient awake    Reviewed: Allergy & Precautions, NPO status , Patient's Chart, lab work & pertinent test results  History of Anesthesia Complications Negative for: history of anesthetic complications  Airway Mallampati: III  TM Distance: >3 FB Neck ROM: Full    Dental no notable dental hx. (+) Teeth Intact, Dental Advisory Given   Pulmonary asthma (no longer requires inhalers) , former smoker,    Pulmonary exam normal        Cardiovascular hypertension, Pt. on medications Normal cardiovascular exam     Neuro/Psych PSYCHIATRIC DISORDERS Anxiety Depression negative neurological ROS     GI/Hepatic Neg liver ROS, GERD  Controlled,  Endo/Other  negative endocrine ROS  Renal/GU negative Renal ROS  negative genitourinary   Musculoskeletal  (+) Arthritis , Osteoarthritis,    Abdominal   Peds  Hematology negative hematology ROS (+)   Anesthesia Other Findings   Reproductive/Obstetrics                            Anesthesia Physical Anesthesia Plan  ASA: II  Anesthesia Plan: General   Post-op Pain Management:    Induction: Intravenous  PONV Risk Score and Plan: 3 and Ondansetron, Dexamethasone, Treatment may vary due to age or medical condition and Midazolam  Airway Management Planned: Oral ETT  Additional Equipment: None  Intra-op Plan:   Post-operative Plan: Extubation in OR  Informed Consent: I have reviewed the patients History and Physical, chart, labs and discussed the procedure including the risks, benefits and alternatives for the proposed anesthesia with the patient or authorized representative who has indicated his/her understanding and acceptance.   Dental advisory given  Plan Discussed with: CRNA, Anesthesiologist and Surgeon  Anesthesia Plan Comments:        Anesthesia Quick Evaluation

## 2017-12-31 NOTE — Progress Notes (Signed)
Subjective: The patient is somnolent but easily arousable.  She is in no apparent distress.  Objective: Vital signs in last 24 hours: Temp:  [98.1 F (36.7 C)-98.2 F (36.8 C)] 98.1 F (36.7 C) (10/24 1510) Pulse Rate:  [66-86] 83 (10/24 1540) Resp:  [9-18] 12 (10/24 1540) BP: (133-153)/(78-80) 141/80 (10/24 1540) SpO2:  [97 %-99 %] 99 % (10/24 1540) Weight:  [63.2 kg] 63.2 kg (10/24 0811) Estimated body mass index is 25.48 kg/m as calculated from the following:   Height as of this encounter: 5\' 2"  (1.575 m).   Weight as of this encounter: 63.2 kg.   Intake/Output from previous day: No intake/output data recorded. Intake/Output this shift: Total I/O In: 3310 [I.V.:3100; Blood:110; IV Piggyback:100] Out: 710 [Urine:310; Blood:400]  Physical exam the patient is somnolent but arousable.  She is moving her lower extremities well.  Lab Results: Recent Labs    12/30/17 1351  WBC 6.6  HGB 11.7*  HCT 37.9  PLT 346   BMET Recent Labs    12/30/17 1351  NA 139  K 3.9  CL 109  CO2 23  GLUCOSE 105*  BUN 21  CREATININE 1.11*  CALCIUM 9.9    Studies/Results: Dg Lumbar Spine 2-3 Views  Result Date: 12/31/2017 CLINICAL DATA:  Status post surgical posterior fusion of L3-4, L4-5 and L5-S1. EXAM: DG C-ARM 61-120 MIN; LUMBAR SPINE - 2-3 VIEW FLUOROSCOPY TIME:  30 seconds. COMPARISON:  CT scan September 21, 2017. FINDINGS: Four intraoperative fluoroscopic images of the lower lumbar spine demonstrate the patient be status post surgical posterior fusion of L3-4, L4-5 and L5-S1 with bilateral intrapedicular screw placement and interbody fusion. IMPRESSION: Status post surgical posterior fusion of lower lumbar spine as described above. Electronically Signed   By: Lupita Raider, M.D.   On: 12/31/2017 15:46   Dg C-arm 1-60 Min  Result Date: 12/31/2017 CLINICAL DATA:  Status post surgical posterior fusion of L3-4, L4-5 and L5-S1. EXAM: DG C-ARM 61-120 MIN; LUMBAR SPINE - 2-3 VIEW  FLUOROSCOPY TIME:  30 seconds. COMPARISON:  CT scan September 21, 2017. FINDINGS: Four intraoperative fluoroscopic images of the lower lumbar spine demonstrate the patient be status post surgical posterior fusion of L3-4, L4-5 and L5-S1 with bilateral intrapedicular screw placement and interbody fusion. IMPRESSION: Status post surgical posterior fusion of lower lumbar spine as described above. Electronically Signed   By: Lupita Raider, M.D.   On: 12/31/2017 15:46    Assessment/Plan: The patient is doing well.  I spoke with her husband.  LOS: 0 days     Lindsey Hill 12/31/2017, 3:50 PM

## 2018-01-01 LAB — BASIC METABOLIC PANEL
ANION GAP: 11 (ref 5–15)
BUN: 10 mg/dL (ref 8–23)
CHLORIDE: 106 mmol/L (ref 98–111)
CO2: 24 mmol/L (ref 22–32)
Calcium: 9 mg/dL (ref 8.9–10.3)
Creatinine, Ser: 0.76 mg/dL (ref 0.44–1.00)
GFR calc non Af Amer: >60 mL/min (ref >60)
Glucose, Bld: 147 mg/dL — ABNORMAL HIGH (ref 70–99)
Potassium: 4.1 mmol/L (ref 3.5–5.1)
Sodium: 141 mmol/L (ref 135–145)

## 2018-01-01 LAB — CBC
HEMATOCRIT: 31.8 % — AB (ref 36.0–46.0)
HEMOGLOBIN: 9.9 g/dL — AB (ref 12.0–15.0)
MCH: 29 pg (ref 26.0–34.0)
MCHC: 31.1 g/dL (ref 30.0–36.0)
MCV: 93.3 fL (ref 80.0–100.0)
NRBC: 0 % (ref 0.0–0.2)
Platelets: 307 10*3/uL (ref 150–400)
RBC: 3.41 MIL/uL — ABNORMAL LOW (ref 3.87–5.11)
RDW: 14.3 % (ref 11.5–15.5)
WBC: 10.8 10*3/uL — AB (ref 4.0–10.5)

## 2018-01-01 MED FILL — Thrombin (Recombinant) For Soln 20000 Unit: CUTANEOUS | Qty: 1 | Status: AC

## 2018-01-01 NOTE — Progress Notes (Signed)
Patient rested well during the night once pain was controlled with Oxycodone and  10mg  Hydrocodone. Patient did receive Ambien for sleep and she snored. Patient states this am her pain score is a 3.

## 2018-01-01 NOTE — Evaluation (Signed)
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 Pt currently with functional limitations due to the deficits listed below (see PT Problem List). Pt able to amb in to hallway today with min assist and RW, slow guarded gait; pt will benefit from continued PT in acute setting, may need to practice stairs/work with PT at least once more for possible d/c this weekend  (to see if schedule allows)  Pt will benefit from skilled PT to increase their independence and safety with mobility to allow discharge to the venue listed below.       Follow Up Recommendations Home health PT    Equipment Recommendations  None recommended by PT    Recommendations for Other Services       Precautions / Restrictions Precautions Precautions: Back Precaution Comments: ok to amb to bathroom without LSO Required Braces or Orthoses: Spinal Brace Spinal Brace: Lumbar corset;Applied in sitting position Restrictions Weight Bearing Restrictions: No      Mobility  Bed Mobility Overal bed mobility: Needs Assistance Bed Mobility: Rolling;Sidelying to Sit Rolling: Min guard Sidelying to sit: Mod assist       General bed mobility comments: cues for log roll, assist to elevate trunk, incr time needed  Transfers Overall transfer level: Needs assistance Equipment used: Rolling walker (2 wheeled) Transfers: Sit to/from Stand Sit to Stand: Min assist         General transfer comment: cues for hand placement, assist for anterior superior wt  translation and transition to chair   Ambulation/Gait Ambulation/Gait assistance: Min assist;Min guard Gait Distance (Feet): 50 Feet(10' more) Assistive device: Rolling walker (2 wheeled) Gait Pattern/deviations: Step-through pattern;Decreased stride length     General Gait Details: intermittent assist to maneuver RW around obstacles, cues for posture and step length  Stairs            Wheelchair Mobility    Modified Rankin (Stroke Patients Only)       Balance Overall balance assessment: Needs assistance           Standing balance-Leahy Scale: Fair Standing balance comment: reliant on UEs for dynamic                             Pertinent Vitals/Pain Pain Assessment: 0-10 Pain Score: 5  Pain Location: back and thighs Pain Descriptors / Indicators: Sore;Guarding;Grimacing Pain Intervention(s): Limited activity within patient's tolerance;Monitored during session;Repositioned    Home Living Family/patient expects to be discharged to:: Private residence Living Arrangements: Spouse/significant other Available Help at Discharge: Family;Available 24 hours/day Type of Home: House Home Access: Stairs to enter Entrance Stairs-Rails: Doctor, general practice of Steps: 5 Home Layout: Multi-level;Able to live on main level with bedroom/bathroom Home Equipment: Dan Humphreys - 2 wheels;Shower seat;Bedside commode      Prior Function Level of Independence: Independent               Hand Dominance        Extremity/Trunk Assessment   Upper Extremity Assessment Upper Extremity  Assessment: Defer to OT evaluation    Lower Extremity Assessment Lower Extremity Assessment: Overall WFL for tasks assessed(at least 3 to 3+/5, moves through ROM slowly d/t pain)       Communication   Communication: No difficulties  Cognition Arousal/Alertness: Awake/alert Behavior During Therapy: WFL for tasks assessed/performed Overall Cognitive Status: Within  Functional Limits for tasks assessed                                        General Comments      Exercises     Assessment/Plan    PT Assessment Patient needs continued PT services  PT Problem List Decreased mobility;Decreased knowledge of use of DME;Decreased activity tolerance;Decreased knowledge of precautions;Decreased balance       PT Treatment Interventions DME instruction;Gait training;Therapeutic activities;Therapeutic exercise;Patient/family education;Functional mobility training;Stair training    PT Goals (Current goals can be found in the Care Plan section)  Acute Rehab PT Goals Patient Stated Goal: less pain with movement PT Goal Formulation: With patient Time For Goal Achievement: 01/15/18 Potential to Achieve Goals: Good    Frequency Min 5X/week   Barriers to discharge        Co-evaluation PT/OT/SLP Co-Evaluation/Treatment: Yes Reason for Co-Treatment: To address functional/ADL transfers;For patient/therapist safety PT goals addressed during session: Mobility/safety with mobility;Proper use of DME         AM-PAC PT "6 Clicks" Daily Activity  Outcome Measure Difficulty turning over in bed (including adjusting bedclothes, sheets and blankets)?: Unable Difficulty moving from lying on back to sitting on the side of the bed? : Unable Difficulty sitting down on and standing up from a chair with arms (e.g., wheelchair, bedside commode, etc,.)?: Unable Help needed moving to and from a bed to chair (including a wheelchair)?: A Little Help needed walking in hospital room?: A Little Help needed climbing 3-5 steps with a railing? : A Lot 6 Click Score: 11    End of Session Equipment Utilized During Treatment: Gait belt Activity Tolerance: Patient tolerated treatment well Patient left: in chair;with call bell/phone within reach;with family/visitor present   PT Visit Diagnosis: Difficulty in walking, not elsewhere classified (R26.2)    Time:  1020-1047 PT Time Calculation (min) (ACUTE ONLY): 27 min   Charges:   PT Evaluation $PT Eval Low Complexity: 1 Low          Drucilla Chalet, PT  Pager: [phone] Acute Rehab Dept [facility]): [phone]   01/01/2018   [facility] 01/01/2018, 11:09 AM

## 2018-01-01 NOTE — Evaluation (Signed)
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 She will benefit from acute OT without need for follow up.    Follow Up Recommendations  No OT follow up;Supervision/Assistance - 24 hour    Equipment Recommendations  None recommended by OT       Precautions / Restrictions Precautions Precautions: Back Precaution Comments: ok to amb to bathroom without LSO Required Braces or Orthoses: Spinal Brace Spinal Brace: Lumbar corset;Applied in sitting position Restrictions Weight Bearing Restrictions: No      Mobility Bed Mobility Overal bed mobility: Needs Assistance Bed Mobility: Rolling;Sidelying to Sit Rolling: Min guard Sidelying to sit: Mod assist       General bed mobility comments: cues for log roll, assist to elevate trunk, incr time needed  Transfers Overall transfer level: Needs assistance Equipment used: Rolling walker (2 wheeled) Transfers: Sit to/from Stand Sit to Stand: Min assist         General transfer comment: cues for hand placement, assist for anterior superior wt translation and transition to chair     Balance Overall balance assessment: Needs assistance           Standing balance-Leahy Scale: Fair Standing balance comment: reliant on UEs for dynamic                           ADL either performed or assessed with  clinical judgement   ADL Overall ADL's : Needs assistance/impaired Eating/Feeding: Independent;Sitting   Grooming: Min guard;Standing   Upper Body Bathing: Set up;Supervision/ safety;Sitting   Lower Body Bathing: Moderate assistance Lower Body Bathing Details (indicate cue type and reason): min A sit<>stand Upper Body Dressing : Set up;Supervision/safety;Sitting   Lower Body Dressing: Moderate assistance Lower Body Dressing Details (indicate cue type and reason): min A sit<>stand Toilet Transfer: Minimal assistance;Ambulation;RW;BSC Toilet Transfer Details (indicate cue type and reason): over toilet Toileting- Clothing Manipulation and Hygiene: Min guard;Sit to/from stand         General ADL Comments: Educated to not sit for more than 20-30 minutes at a time, get up and walk around then can sit for another 20-30 minutes     Vision Patient Visual Report: No change from baseline              Pertinent Vitals/Pain Pain Assessment: 0-10 Pain Score: 5  Pain Location: back and thighs Pain Descriptors / Indicators: Sore;Guarding;Grimacing Pain Intervention(s): Limited activity within patient's tolerance;Monitored during session;Repositioned;Premedicated before session     Hand Dominance  right   Extremity/Trunk Assessment Upper Extremity Assessment Upper Extremity Assessment: Overall WFL for tasks assessed   Lower Extremity Assessment Lower Extremity Assessment: Overall WFL for tasks assessed(at least 3 to 3+/5, moves through ROM slowly d/t pain)       Communication Communication Communication: No difficulties   Cognition Arousal/Alertness: Awake/alert Behavior During Therapy: WFL for tasks assessed/performed Overall  Cognitive Status: Within Functional Limits for tasks assessed                                                Home Living Family/patient expects to be discharged to:: Private residence Living Arrangements: Spouse/significant  other Available Help at Discharge: Family;Available 24 hours/day Type of Home: House Home Access: Stairs to enter [employer] of Steps: 5 Entrance Stairs-Rails: Right;Left Home Layout: Multi-level;Able to live on main level with bedroom/bathroom Alternate Level Stairs-Number of Steps: 13 Alternate Level Stairs-Rails: Can reach both Bathroom Shower/Tub: Chief Strategy Officer: Standard     Home Equipment: Environmental consultant - 2 wheels;Shower seat;Bedside commode          Prior Functioning/Environment Level of Independence: Independent                 OT Problem List: Decreased strength;Decreased range of motion;Impaired balance (sitting and/or standing);Pain      OT Treatment/Interventions: Self-care/ADL training;DME and/or AE instruction;Patient/family education    OT Goals(Current goals can be found in the care plan section) Acute Rehab OT Goals Patient Stated Goal: less pain with movement OT Goal Formulation: With patient Time For Goal Achievement: 01/15/18 Potential to Achieve Goals: Good  OT Frequency: Min 2X/week           Co-evaluation PT/OT/SLP Co-Evaluation/Treatment: Yes Reason for Co-Treatment: To address functional/ADL transfers;For patient/therapist safety PT goals addressed during session: Mobility/safety with mobility;Proper use of DME OT goals addressed during session: ADL's and self-care;Strengthening/ROM      AM-PAC PT "6 Clicks" Daily Activity     Outcome Measure Help from another person eating meals?: None Help from another person taking care of personal grooming?: A Little Help from another person toileting, which includes using toliet, bedpan, or urinal?: A Little Help from another person bathing (including washing, rinsing, drying)?: A Lot Help from another person to put on and taking off regular upper body clothing?: A Little Help from another person to put on and taking off regular lower body clothing?: A Lot 6 Click Score:  17   End of Session Equipment Utilized During Treatment: Gait belt;Rolling walker;Back brace Nurse Communication: Mobility status  Activity Tolerance: Patient tolerated treatment well Patient left: in chair;with call bell/phone within reach;with nursing/sitter in room  OT Visit Diagnosis: Other abnormalities of gait and mobility (R26.89);Pain Pain - part of body: (back and front of legs)                Time: 1020-1047 OT Time Calculation (min): 27 min Charges:  OT General Charges $OT Visit: 1 Visit OT Evaluation $OT Eval Moderate Complexity: 1 Mod  Lindsey Hill, OTR/L Acute [employer] Pager [phone] Office [phone]     Evette Georges 01/01/2018, 11:14 AM

## 2018-01-01 NOTE — Progress Notes (Signed)
Subjective: The patient is alert and pleasant.  Her back is appropriately sore.  Objective: Vital signs in last 24 hours: Temp:  [97.7 F (36.5 C)-98.6 F (37 C)] 98.6 F (37 C) (10/25 0400) Pulse Rate:  [73-86] 81 (10/24 2000) Resp:  [9-20] 14 (10/24 1825) BP: (108-147)/(71-94) 147/83 (10/24 2024) SpO2:  [96 %-100 %] 96 % (10/24 2000) Estimated body mass index is 25.48 kg/m as calculated from the following:   Height as of this encounter: 5\' 2"  (1.575 m).   Weight as of this encounter: 63.2 kg.   Intake/Output from previous day: 10/24 0701 - 10/25 0700 In: 4173.2 [P.O.:460; I.V.:3303.2; Blood:110; IV Piggyback:300] Out: 2910 [Urine:2510; Blood:400] Intake/Output this shift: No intake/output data recorded.  Physical exam the patient is alert and oriented.  Her lower extremity strength is grossly normal.  Lab Results: Recent Labs    12/30/17 1351 01/01/18 0554  WBC 6.6 10.8*  HGB 11.7* 9.9*  HCT 37.9 31.8*  PLT 346 307   BMET Recent Labs    12/30/17 1351 01/01/18 0554  NA 139 141  K 3.9 4.1  CL 109 106  CO2 23 24  GLUCOSE 105* 147*  BUN 21 10  CREATININE 1.11* 0.76  CALCIUM 9.9 9.0    Studies/Results: Dg Lumbar Spine 2-3 Views  Result Date: 12/31/2017 CLINICAL DATA:  Status post surgical posterior fusion of L3-4, L4-5 and L5-S1. EXAM: DG C-ARM 61-120 MIN; LUMBAR SPINE - 2-3 VIEW FLUOROSCOPY TIME:  30 seconds. COMPARISON:  CT scan September 21, 2017. FINDINGS: Four intraoperative fluoroscopic images of the lower lumbar spine demonstrate the patient be status post surgical posterior fusion of L3-4, L4-5 and L5-S1 with bilateral intrapedicular screw placement and interbody fusion. IMPRESSION: Status post surgical posterior fusion of lower lumbar spine as described above. Electronically Signed   By: Lupita Raider, M.D.   On: 12/31/2017 15:46   Dg C-arm 1-60 Min  Result Date: 12/31/2017 CLINICAL DATA:  Status post surgical posterior fusion of L3-4, L4-5 and L5-S1.  EXAM: DG C-ARM 61-120 MIN; LUMBAR SPINE - 2-3 VIEW FLUOROSCOPY TIME:  30 seconds. COMPARISON:  CT scan September 21, 2017. FINDINGS: Four intraoperative fluoroscopic images of the lower lumbar spine demonstrate the patient be status post surgical posterior fusion of L3-4, L4-5 and L5-S1 with bilateral intrapedicular screw placement and interbody fusion. IMPRESSION: Status post surgical posterior fusion of lower lumbar spine as described above. Electronically Signed   By: Lupita Raider, M.D.   On: 12/31/2017 15:46    Assessment/Plan: Postop day #1: The patient is doing well.  She will likely home over the weekend.  I gave her her discharge instructions and answered all her questions.  LOS: 1 day     Lindsey Hill 01/01/2018, 8:11 AM

## 2018-01-02 NOTE — Progress Notes (Signed)
Pt called out saying the pain in her back woke her up it was so bad.  She is close to her maximum limit for acetaminophen so 10 mg of OxyContin was given.  I will re-evaluate in 30 minutes and give morphine if needed.

## 2018-01-02 NOTE — Progress Notes (Signed)
Subjective: Patient reports doing well except severe incisional pain minimal radicular pain legs are better than preop  Objective: Vital signs in last 24 hours: Temp:  [98.3 F (36.8 C)-99.7 F (37.6 C)] 98.7 F (37.1 C) (10/26 0815) Pulse Rate:  [76-95] 95 (10/26 0815) Resp:  [18] 18 (10/26 0815) BP: (99-113)/(54-71) 110/58 (10/26 0815) SpO2:  [91 %-98 %] 96 % (10/26 0815)  Intake/Output from previous day: 10/25 0701 - 10/26 0700 In: 298.3 [P.O.:240; I.V.:58.3] Out: -  Intake/Output this shift: No intake/output data recorded.  strength out of 5 wound clean dry and intact  Lab Results: Recent Labs    12/30/17 1351 01/01/18 0554  WBC 6.6 10.8*  HGB 11.7* 9.9*  HCT 37.9 31.8*  PLT 346 307   BMET Recent Labs    12/30/17 1351 01/01/18 0554  NA 139 141  K 3.9 4.1  CL 109 106  CO2 23 24  GLUCOSE 105* 147*  BUN 21 10  CREATININE 1.11* 0.76  CALCIUM 9.9 9.0    Studies/Results: Dg Lumbar Spine 2-3 Views  Result Date: 12/31/2017 CLINICAL DATA:  Status post surgical posterior fusion of L3-4, L4-5 and L5-S1. EXAM: DG C-ARM 61-120 MIN; LUMBAR SPINE - 2-3 VIEW FLUOROSCOPY TIME:  30 seconds. COMPARISON:  CT scan September 21, 2017. FINDINGS: Four intraoperative fluoroscopic images of the lower lumbar spine demonstrate the patient be status post surgical posterior fusion of L3-4, L4-5 and L5-S1 with bilateral intrapedicular screw placement and interbody fusion. IMPRESSION: Status post surgical posterior fusion of lower lumbar spine as described above. Electronically Signed   By: Lupita Raider, M.D.   On: 12/31/2017 15:46   Dg C-arm 1-60 Min  Result Date: 12/31/2017 CLINICAL DATA:  Status post surgical posterior fusion of L3-4, L4-5 and L5-S1. EXAM: DG C-ARM 61-120 MIN; LUMBAR SPINE - 2-3 VIEW FLUOROSCOPY TIME:  30 seconds. COMPARISON:  CT scan September 21, 2017. FINDINGS: Four intraoperative fluoroscopic images of the lower lumbar spine demonstrate the patient be status post  surgical posterior fusion of L3-4, L4-5 and L5-S1 with bilateral intrapedicular screw placement and interbody fusion. IMPRESSION: Status post surgical posterior fusion of lower lumbar spine as described above. Electronically Signed   By: Lupita Raider, M.D.   On: 12/31/2017 15:46    Assessment/Plan: Continue to mobilize with physical outpatient therapy work on pain management and pain control.  LOS: 2 days    Mistey Hoffert P 01/02/2018, 8:40 AM

## 2018-01-02 NOTE — Progress Notes (Signed)
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 The patient did not know precautions but knew she needed to use there brace. The husband came in during the treatment. The patient and husband were educated on how to don/doff brace and reviewed back precautions. The husband was able to demonstrate donning and doffing brace and back precautions. The spouse reported they are going to be able to complete 24/7 care at home and can assist for LE dressing. The patient required moderate assist with supine to sit and sit to supine with HOB elevated and bed rails in place. Updated discharge to Occupational Therapy Home Health with 24/7 support.    Follow Up Recommendations  Home health OT    Equipment Recommendations       Recommendations for Other Services      Precautions / Restrictions Precautions Precautions: Back Precaution Booklet Issued: Yes (comment) Precaution Comments: ok to amb to bathroom without LSO Required Braces or Orthoses: Spinal Brace Spinal Brace: Lumbar corset;Applied in sitting position Restrictions Weight Bearing Restrictions: No       Mobility Bed Mobility Overal bed mobility: Needs Assistance Bed Mobility: Supine to Sit;Sit to Supine Rolling: Min guard Sidelying to sit: Mod assist Supine to sit: Mod assist Sit to supine: Mod assist   General bed mobility comments: pt required max cues to sequence and technique. the pateint is guarding due to pain and husband present to obsever    Transfers Overall transfer level: Needs assistance Equipment used: Rolling walker (2 wheeled) Transfers: Sit to/from Stand Sit to Stand: Min guard         General transfer comment: vcs for hand placement    Balance Overall balance assessment: Needs assistance Sitting-balance support: Feet supported Sitting balance-Leahy Scale: Fair     Standing balance support: Bilateral upper extremity supported Standing balance-Leahy Scale: Fair                             ADL either performed or assessed with clinical judgement   ADL Overall ADL's : Needs assistance/impaired Eating/Feeding: Independent;Sitting   Grooming: Sitting;Min guard                   Toilet Transfer: Min guard;RW             General ADL Comments: Educated the pt and spouse about LE dressing. per spouse reports he will be completing LE dressing at home.      Vision       Perception     Praxis      Cognition Arousal/Alertness: Awake/alert Behavior During Therapy: WFL for tasks assessed/performed Overall Cognitive Status: Impaired/Different from baseline Area of Impairment: Attention;Following commands;Safety/judgement;Awareness;Problem solving                   Current Attention Level: Selective   Following Commands: Follows one step commands consistently Safety/Judgement: Decreased awareness of safety Awareness: Intellectual Problem Solving: Slow processing;Requires verbal cues  Exercises     Shoulder Instructions       General Comments needs further reinforcement of precautions    Pertinent Vitals/ Pain       Pain Assessment: 0-10 Pain Score: 7  Pain Location: back Pain Descriptors / Indicators: Aching;Grimacing;Guarding Pain Intervention(s): Monitored during session;Other (comment)(spoke to nurse)  Home Living                                          Prior Functioning/Environment              Frequency  Min  2X/week        Progress Toward Goals  OT Goals(current goals can now be found in the care plan section)  Progress towards OT goals: Progressing toward goals  ADL Goals Pt Will Perform Grooming: with set-up;with supervision;standing Pt Will Perform Lower Body Dressing: with supervision;with set-up;sit to/from stand;with adaptive equipment Pt Will Perform Tub/Shower Transfer: Tub transfer;with supervision;ambulating;shower seat;rolling walker Additional ADL Goal #1: Pt will be S in and OOB for basic ADLs without rail and HOB flat Additional ADL Goal #2: Pt will be able to state back precautions  Plan Discharge plan needs to be updated    Co-evaluation          OT goals addressed during session: ADL's and self-care      AM-PAC PT "6 Clicks" Daily Activity     Outcome Measure   Help from another person eating meals?: None Help from another person taking care of personal grooming?: A Little Help from another person toileting, which includes using toliet, bedpan, or urinal?: A Little Help from another person bathing (including washing, rinsing, drying)?: A Lot Help from another person to put on and taking off regular upper body clothing?: A Little Help from another person to put on and taking off regular lower body clothing?: A Lot 6 Click Score: 17    End of Session Equipment Utilized During Treatment: Gait belt;Rolling walker  OT Visit Diagnosis: Pain;Unsteadiness on feet (R26.81);Muscle weakness (generalized) (M62.81) Pain - part of body: (back)   Activity Tolerance Patient limited by pain   Patient Left in bed;with call bell/phone within reach;with family/visitor present   Nurse Communication Patient requests pain meds        Time: 1610-9604 OT Time Calculation (min): 46 min  Charges: OT General Charges $OT Visit: 1 Visit OT Treatments $Self Care/Home Management : 38-52 mins  Alphia Moh OTR/L  Acute Rehab Services  [phone] office  number [phone] pager number    Alphia Moh 01/02/2018, 5:44 PM

## 2018-01-02 NOTE — Progress Notes (Signed)
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  Emphasis on stair training and education wrap up.  Pt/husband verbalized understanding.   Follow Up Recommendations  Home health PT     Equipment Recommendations  None recommended by PT    Recommendations for Other Services       Precautions / Restrictions Precautions Precautions: Back Precaution Comments: ok to amb to bathroom without LSO Required Braces or Orthoses: Spinal Brace Spinal Brace: Lumbar corset;Applied in sitting position    Mobility  Bed Mobility Overal bed mobility: Needs Assistance Bed Mobility: Rolling;Sidelying to Sit Rolling: Min guard         General bed mobility comments: demo'd safe technique to let pt/husband visualize  Transfers Overall transfer level: Needs assistance Equipment used: Rolling walker (2 wheeled) Transfers: Sit to/from Stand Sit to Stand: Min assist         General transfer comment: cues for hand placement.  assist forward and assist descent  Ambulation/Gait Ambulation/Gait assistance: Min assist;Min guard Gait Distance (Feet): 200 Feet Assistive device: Rolling walker (2 wheeled) Gait Pattern/deviations: Step-through pattern   Gait velocity interpretation: <1.8 ft/sec, indicate of risk for recurrent falls General Gait Details: stiff and guarded with position to far outside of the RW.  Pluse continual drift to the left, not noting obstacles to the left.  cued and practiced decreasing all these deviations and making husband  aware of these and what is normal.   Stairs Stairs: Yes Stairs assistance: Min assist Stair Management: Step to pattern;Forwards Number of Stairs: 8 General stair comments: safe with assist   Wheelchair Mobility    Modified Rankin (Stroke Patients Only)       Balance Overall balance assessment: Needs assistance   Sitting balance-Leahy Scale: Fair       Standing balance-Leahy Scale: Fair Standing balance comment: reliant on UEs for dynamic                            Cognition Arousal/Alertness: Awake/alert Behavior During Therapy: WFL for tasks assessed/performed Overall Cognitive Status: Within Functional Limits for tasks assessed                                        Exercises      General Comments General comments (skin integrity, edema, etc.): Extensive reinforcement of all education to pt and husband.      Pertinent Vitals/Pain Pain Assessment: Faces Faces Pain Scale: Hurts even more Pain Location: back and thighs Pain Descriptors / Indicators: Sore;Guarding;Grimacing Pain Intervention(s): Monitored during session;Premedicated before session    Home Living                      Prior Function            PT Goals (current goals can now be found in the care plan section) Acute Rehab PT Goals Patient Stated Goal:  less pain with movement PT Goal Formulation: With patient Time For Goal Achievement: 01/15/18 Potential to Achieve Goals: Good Progress towards PT goals: Progressing toward goals    Frequency    Min 5X/week      PT Plan Current plan remains appropriate    Co-evaluation              AM-PAC PT "6 Clicks" Daily Activity  Outcome Measure  Difficulty turning over in bed (including adjusting bedclothes, sheets and blankets)?: Unable Difficulty moving from lying on back to sitting on the side of the bed? : Unable Difficulty sitting down on and standing up from a chair with arms (e.g.,  wheelchair, bedside commode, etc,.)?: Unable Help needed moving to and from a bed to chair (including a wheelchair)?: A Little Help needed walking in hospital room?: A Little Help needed climbing 3-5 steps with a railing? : A Little 6 Click Score: 12    End of Session   Activity Tolerance: Patient tolerated treatment well Patient left: in chair;with call bell/phone within reach;with family/visitor present Nurse Communication: Mobility status PT Visit Diagnosis: Difficulty in walking, not elsewhere classified (R26.2)     Time: 9604-5409 PT Time Calculation (min) (ACUTE ONLY): 45 min  Charges:  $Gait Training: 23-37 mins $Therapeutic Activity: 8-22 mins                     01/02/2018  Arbovale Bing, PT Acute Rehabilitation Services [phone]  (pager) [phone]  (office)   Lindsey Hill 01/02/2018, 11:09 AM

## 2018-01-02 NOTE — Progress Notes (Signed)
Pt called out again asking for more pain medicine, this time requesting the morphine.  After receiving morphine she was able to ambulate to the bathroom and then walk through the halls and back to bed.  I will continue to reassess her pain and treat accordingly.

## 2018-01-03 MED ORDER — METHYLPREDNISOLONE 4 MG PO TBPK
4.0000 mg | ORAL_TABLET | Freq: Three times a day (TID) | ORAL | Status: DC
  Administered 2018-01-04: 4 mg via ORAL

## 2018-01-03 MED ORDER — METHYLPREDNISOLONE 4 MG PO TBPK: 8.0000 mg | ORAL_TABLET | Freq: Every evening | ORAL | Status: DC

## 2018-01-03 MED ORDER — METHYLPREDNISOLONE 4 MG PO TBPK
4.0000 mg | ORAL_TABLET | ORAL | Status: AC
  Administered 2018-01-03: 4 mg via ORAL

## 2018-01-03 MED ORDER — METHYLPREDNISOLONE 4 MG PO TBPK
8.0000 mg | ORAL_TABLET | Freq: Every morning | ORAL | Status: AC
  Administered 2018-01-03: 8 mg via ORAL
  Filled 2018-01-03: qty 21

## 2018-01-03 MED ORDER — METHYLPREDNISOLONE 4 MG PO TBPK
8.0000 mg | ORAL_TABLET | Freq: Every evening | ORAL | Status: AC
  Administered 2018-01-03: 8 mg via ORAL

## 2018-01-03 MED ORDER — OXYCODONE HCL 10 MG PO TABS: 10.0000 mg | ORAL_TABLET | ORAL | 0 refills | Status: DC | PRN

## 2018-01-03 MED ORDER — METHYLPREDNISOLONE 4 MG PO TBPK: 4.0000 mg | ORAL_TABLET | Freq: Four times a day (QID) | ORAL | Status: DC

## 2018-01-03 MED ORDER — METHYLPREDNISOLONE 4 MG PO TBPK
4.0000 mg | ORAL_TABLET | ORAL | Status: AC
  Administered 2018-01-03: 4 mg via ORAL
  Filled 2018-01-03: qty 21

## 2018-01-03 NOTE — Progress Notes (Addendum)
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 Pt currently with functional limitations due to the deficits listed below (see PT Problem List). Pt was able to ambulate to bathroom with RW with min assist.  Pt refused to walk in hall stating she has done so earlier today more than once.  Pt also stated she had already completed stair training yesterday and was not doing it today. Husband present and states that bed mobility was difficult and this PT let him know that it was worked on and reviewed at beginning of session.  Pt needs min assist and cues for log roll for proper technique.  Pt stayed up in chair to wait for dinner arrival.   Pt will benefit from skilled PT to increase their independence and safety with mobility to allow discharge to the venue listed below.     Follow Up Recommendations  Home health PT     Equipment Recommendations  None recommended by PT    Recommendations for Other Services       Precautions / Restrictions Precautions Precautions: Back Precaution Booklet Issued: Yes (comment) Precaution Comments: ok to amb to bathroom without LSO Required Braces or Orthoses: Spinal Brace Spinal Brace: Lumbar corset;Applied in sitting position Restrictions Weight Bearing Restrictions: No    Mobility  Bed Mobility Overal bed mobility: Needs Assistance Bed Mobility: Supine to Sit;Sit to Supine Rolling: Min guard Sidelying to sit: Min assist       General bed mobility comments: Needed cues for log roll  and then min assist to come to sitting as she cannot push up from bed.    Transfers Overall transfer level: Needs assistance Equipment used: Rolling walker (2 wheeled) Transfers: Sit to/from Stand Sit to Stand: Supervision         General transfer comment: vcs for hand placement.  Pt placed brace on by herself.   Ambulation/Gait Ambulation/Gait assistance: Min assist;Min guard Gait Distance (Feet): 30 Feet Assistive device: Rolling walker (2 wheeled) Gait Pattern/deviations: Step-through pattern   Gait velocity interpretation: <1.8 ft/sec, indicate of risk for recurrent falls General Gait Details: stiff and guarded with position to far outside of the RW unless cued.  Some cues needed with turns. Would only walk to bathroom, used toilet, cleaned herself and then washed hands at sink.     Stairs             Wheelchair Mobility    Modified Rankin (Stroke Patients Only)       Balance Overall balance assessment: Needs assistance Sitting-balance support: Feet supported;No upper extremity supported Sitting balance-Leahy Scale: Good     Standing balance support: Bilateral upper extremity supported Standing balance-Leahy Scale: Fair Standing balance comment: reliant on UEs for dynamic, can stand statically without UE support                            Cognition Arousal/Alertness: Suspect due to medications;Lethargic Behavior During Therapy: WFL for tasks assessed/performed Overall Cognitive Status:  Impaired/Different from baseline Area of Impairment: Attention;Following commands;Safety/judgement;Awareness;Problem solving                   Current Attention Level: Selective   Following Commands: Follows one step commands consistently Safety/Judgement: Decreased awareness of safety Awareness: Intellectual Problem Solving: Slow processing;Requires verbal cues General Comments: most  likely due to pain meds      Exercises      General Comments         Pertinent Vitals/Pain Pain Assessment: 0-10 Pain Score: 8  Pain Location: back Pain Descriptors / Indicators: Aching;Grimacing;Guarding Pain Intervention(s): Limited activity within patient's tolerance;Monitored during session;Premedicated before session;Repositioned    Home Living                      Prior Function            PT Goals (current goals can now be found in the care plan section) Acute Rehab PT Goals Patient Stated Goal: less pain with movement Progress towards PT goals: Progressing toward goals    Frequency    Min 5X/week      PT Plan Current plan remains appropriate    Co-evaluation              AM-PAC PT "6 Clicks" Daily Activity  Outcome Measure  Difficulty turning over in bed (including adjusting bedclothes, sheets and blankets)?: Unable Difficulty moving from lying on back to sitting on the side of the bed? : Unable Difficulty sitting down on and standing up from a chair with arms (e.g., wheelchair, bedside commode, etc,.)?: Unable Help needed moving to and from a bed to chair (including a wheelchair)?: A Little Help needed walking in hospital room?: A Little Help needed climbing 3-5 steps with a railing? : A Little 6 Click Score: 12    End of Session Equipment Utilized During Treatment: Gait belt Activity Tolerance: Patient tolerated treatment well Patient left: in chair;with call bell/phone within reach;with family/visitor present Nurse Communication: Mobility status PT Visit Diagnosis: Difficulty in walking, not elsewhere classified (R26.2)     Time: 1700-1715 PT Time Calculation (min) (ACUTE ONLY): 15 min  Charges:  $Gait Training: 8-22 mins                     Ellerie Arenz,PT Acute Rehabilitation Services Pager:  [phone]  Office:  [phone]     Berline Lopes 01/03/2018, 5:34 PM

## 2018-01-03 NOTE — Progress Notes (Signed)
Pt ambulating in hall when incision site began to bleed. Redressed honeycomb dressing. Dressing now clean, dry, and intact. Will continue to monitor for excessive bleeding.

## 2018-01-03 NOTE — Progress Notes (Signed)
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 Husband present for session. Ambulated @ 150 ft followed by ADL. Educated husband/pt on use of AE for pericare after toileting and compensatory strategies for back precautions.   Follow Up Recommendations  Home health OT    Equipment Recommendations  None recommended by OT    Recommendations for Other Services      Precautions / Restrictions Precautions Precautions: Back Required Braces or Orthoses: Spinal Brace Spinal Brace: Lumbar corset;Applied in sitting position Restrictions Weight Bearing Restrictions: No       Mobility Bed Mobility               General bed mobility comments: OOB in chair. Husband states bed mobility is very difficult for pt  Transfers Overall transfer level: Needs assistance Equipment used: Rolling walker (2 wheeled) Transfers: Sit to/from Stand Sit to Stand: Supervision         General transfer comment: vcs for hand placement    Balance     Sitting balance-Leahy Scale: Good       Standing balance-Leahy Scale: Fair                             ADL either performed or assessed with clinical judgement   ADL Overall ADL's : Needs assistance/impaired     Grooming: Supervision/safety;Standing Grooming Details (indicate cue type and reason): Educated on correct technique                 Toilet Transfer: Min guard;Ambulation;Cueing for safety;RW;BSC    Toileting- Clothing Manipulation and Hygiene: Moderate assistance Toileting - Clothing Manipulation Details (indicate cue type and reason): Pt unable to reach peri area. Educated on use of tongs for [employer]; husband present       General ADL Comments: Have reacher at home     Vision       Perception     Praxis      Cognition Arousal/Alertness: Lethargic;Suspect due to medications Behavior During Therapy: WFL for tasks assessed/performed Overall Cognitive Status: Impaired/Different from baseline                                 General Comments: most  liely due to pain meds        Exercises     Shoulder Instructions       General Comments      Pertinent Vitals/ Pain       Pain Assessment: 0-10 Pain Score: 8  Pain Location: back Pain Descriptors / Indicators: Aching;Grimacing;Guarding Pain Intervention(s): Limited activity within patient's tolerance;Repositioned  Home Living                                          Prior Functioning/Environment  Frequency  Min 2X/week        Progress Toward Goals  OT Goals(current goals can now be found in the care plan section)  Progress towards OT goals: Progressing toward goals  Acute Rehab OT Goals Patient Stated Goal: less pain with movement OT Goal Formulation: With patient Time For Goal Achievement: 01/15/18 Potential to Achieve Goals: Good ADL Goals Pt Will Perform Grooming: with set-up;with supervision;standing Pt Will Perform Lower Body Dressing: with supervision;with set-up;sit to/from stand;with adaptive equipment Pt Will Perform Tub/Shower Transfer: Tub transfer;with supervision;ambulating;shower seat;rolling walker Additional ADL Goal #1: Pt will be S in and OOB for basic ADLs without rail and HOB flat Additional ADL Goal #2: Pt will be able to state back precautions  Plan Discharge plan remains appropriate;Frequency remains appropriate     Co-evaluation                 AM-PAC PT "6 Clicks" Daily Activity     Outcome Measure   Help from another person eating meals?: None Help from another person taking care of personal grooming?: A Little Help from another person toileting, which includes using toliet, bedpan, or urinal?: A Little Help from another person bathing (including washing, rinsing, drying)?: A Lot Help from another person to put on and taking off regular upper body clothing?: A Little Help from another person to put on and taking off regular lower body clothing?: A Lot 6 Click Score: 17    End of Session Equipment Utilized During Treatment: Gait belt;Rolling walker;Back brace  OT Visit Diagnosis: Pain;Unsteadiness on feet (R26.81);Muscle weakness (generalized) (M62.81) Pain - part of body: (back)   Activity Tolerance Patient tolerated treatment well   Patient Left in chair;with call bell/phone within reach;with family/visitor present   Nurse Communication Mobility status        Time: 1034-1100 OT Time Calculation (min): 26 min  Charges: OT General Charges $OT Visit: 1 Visit OT Treatments $Self Care/Home Management : 23-37 mins  Luisa Dago, OT/L   Acute OT Clinical Specialist Acute Rehabilitation Services Pager [phone] Office [phone]    [facility] 01/03/2018, 11:08 AM

## 2018-01-03 NOTE — Progress Notes (Signed)
Subjective: Patient reports patient doing okay increased back and leg pain from yesterday she is now day 3 after surgery. She denies any new numbness tingling or weakness in her legs.  Objective: Vital signs in last 24 hours: Temp:  [98.5 F (36.9 C)-101.3 F (38.5 C)] 99.9 F (37.7 C) (10/27 0544) Pulse Rate:  [85-103] 96 (10/27 0544) Resp:  [18-20] 18 (10/26 2100) BP: (107-126)/(59-70) 126/70 (10/27 0544) SpO2:  [92 %-98 %] 92 % (10/27 0544)  Intake/Output from previous day: 10/26 0701 - 10/27 0700 In: 360 [P.O.:360] Out: -  Intake/Output this shift: No intake/output data recorded.  strength out of 5 iliopsoas, quads, hamstrings, gastrocs, and tibialis, and EHL. Incision clean dry and intact bandage dry  Lab Results: Recent Labs    01/01/18 0554  WBC 10.8*  HGB 9.9*  HCT 31.8*  PLT 307   BMET Recent Labs    01/01/18 0554  NA 141  K 4.1  CL 106  CO2 24  GLUCOSE 147*  BUN 10  CREATININE 0.76  CALCIUM 9.0    Studies/Results: No results found.  Assessment/Plan: Postoperative day 3 posterior lumbar fusion L3-S1 doing fairly well still some radicular pain we'll add steroids and mobilize physical therapy she initially wanted on today but doesn't feel like the pain is under well enough control to be discharged.  LOS: 3 days     Lindsey Hill P 01/03/2018, 8:40 AM

## 2018-01-04 MED ORDER — OXYCODONE HCL 10 MG PO TABS: 10.0000 mg | ORAL_TABLET | ORAL | 0 refills | Status: DC | PRN

## 2018-01-04 MED ORDER — CYCLOBENZAPRINE HCL 10 MG PO TABS: 10.0000 mg | ORAL_TABLET | Freq: Three times a day (TID) | ORAL | 1 refills | Status: DC | PRN

## 2018-01-04 MED ORDER — OXYCODONE-ACETAMINOPHEN 5-325 MG PO TABS: 1.0000 | ORAL_TABLET | ORAL | 0 refills | Status: DC | PRN

## 2018-01-04 NOTE — Care Management Note (Signed)
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  PTA, pt independent, lives with spouse.                  Action/Plan: PT/OT recommending HH follow up, and pt agreeable to services.  Pt lives in [place]:  Referral made to [facility] in [facility], [place], per pt choice.  Husband to provide care at dc; no DME needs, as pt has all needed DME at home.   Expected Discharge Date:  01/04/18               Expected Discharge Plan:  Home w Home Health Services  In-House Referral:     Discharge planning Services  CM Consult  Post Acute Care Choice:  Home Health Choice offered to:  Patient  DME Arranged:    DME Agency:     HH Arranged:  PT, OT HH Agency:  [facility] (formerly [facility])  Status of Service:  Completed, signed off  If discussed at [employer], dates discussed:    Additional Comments:  Glennon Mac, RN 01/04/2018, 5:29 PM

## 2018-01-04 NOTE — Progress Notes (Signed)
Patient discharged home with family on home health. Discharge instructions given to patient/family, all questions answered and concerns addressed. Patient/family verbalized understanding.

## 2018-01-04 NOTE — Progress Notes (Signed)
Physical Therapy Treatment Patient Details Name: Lindsey Hill MRN: [id number] DOB: 1950-09-11 Today's Date: 01/04/2018    History of Present Illness 67 year old female with hx of previous  L4-5 PLIF ~ 9 mos ago,  chronic back and leg pain, HTN, asthma, arthritis, joint replacement right thumb; MRI and myelo CT which degenerative disc disease and stenosis at L3-4 and L5-S1, pt decided to proceed with  surgery and is now s/p PLIF L3-4, L5-S1 performed on 12/31/17     PT Comments    Pt progressing well. Focused on bed mobility without bed rail and increasing ambulation tolerance. Discussed stopping every 45 min -1 hr during ride home to [place]. Acute PT to cont to follow.   Follow Up Recommendations  Home health PT     Equipment Recommendations  None recommended by PT    Recommendations for Other Services       Precautions / Restrictions Precautions Precautions: Back Precaution Booklet Issued: Yes (comment) Precaution Comments: ok to amb to bathroom without LSO Required Braces or Orthoses: Spinal Brace Spinal Brace: Lumbar corset;Applied in sitting position Restrictions Weight Bearing Restrictions: No    Mobility  Bed Mobility Overal bed mobility: Needs Assistance Bed Mobility: Sidelying to Sit Rolling: (pt in sidelying) Sidelying to sit: Min assist       General bed mobility comments: placed bed rail down to mimic home set up, minA to initiate power up on elbow  Transfers Overall transfer level: Needs assistance Equipment used: None Transfers: Sit to/from Stand Sit to Stand: Supervision         General transfer comment: v/c's for hand placement  Ambulation/Gait Ambulation/Gait assistance: Supervision Gait Distance (Feet): 350 Feet Assistive device: Rolling walker (2 wheeled);None Gait Pattern/deviations: Step-through pattern Gait velocity: slow Gait velocity interpretation: <1.31 ft/sec, indicative of household ambulator General Gait Details: stiff  and guarded, v/c's to stay in walker and minimize UE dependency, amb 50' without RW, pt with decreased step length   Stairs Stairs: Yes Stairs assistance: Min guard Stair Management: One rail Right;Sideways;Step to pattern Number of Stairs: 4 General stair comments: no physical assist needed   Wheelchair Mobility    Modified Rankin (Stroke Patients Only)       Balance Overall balance assessment: Needs assistance Sitting-balance support: Feet supported;No upper extremity supported Sitting balance-Leahy Scale: Good     Standing balance support: No upper extremity supported Standing balance-Leahy Scale: Fair Standing balance comment: pt able to wash hands at sink wihtout difficulty                            Cognition Arousal/Alertness: Awake/alert Behavior During Therapy: WFL for tasks assessed/performed Overall Cognitive Status: Within Functional Limits for tasks assessed                                 General Comments: pt able to recall all of precautions and adhere to them functionally      Exercises      General Comments General comments (skin integrity, edema, etc.): pt able to toliet without assist      Pertinent Vitals/Pain Pain Assessment: 0-10 Pain Score: 5  Pain Location: hips Pain Descriptors / Indicators: Aching Pain Intervention(s): Monitored during session    Home Living                      Prior Function  PT Goals (current goals can now be found in the care plan section) Progress towards PT goals: Progressing toward goals    Frequency    Min 5X/week      PT Plan Current plan remains appropriate    Co-evaluation              AM-PAC PT "6 Clicks" Daily Activity  Outcome Measure  Difficulty turning over in bed (including adjusting bedclothes, sheets and blankets)?: Unable Difficulty moving from lying on back to sitting on the side of the bed? : Unable Difficulty sitting down on  and standing up from a chair with arms (e.g., wheelchair, bedside commode, etc,.)?: Unable Help needed moving to and from a bed to chair (including a wheelchair)?: A Little Help needed walking in hospital room?: A Little Help needed climbing 3-5 steps with a railing? : A Little 6 Click Score: 12    End of Session Equipment Utilized During Treatment: Back brace Activity Tolerance: Patient tolerated treatment well Patient left: in chair;with call bell/phone within reach;with family/visitor present Nurse Communication: Mobility status PT Visit Diagnosis: Difficulty in walking, not elsewhere classified (R26.2)     Time: 2130-8657 PT Time Calculation (min) (ACUTE ONLY): 29 min  Charges:  $Gait Training: 23-37 mins                     Lewis Shock, PT, DPT Acute Rehabilitation Services Pager #: [phone] Office #: [phone]    Iona Hansen 01/04/2018, 7:54 AM

## 2018-01-04 NOTE — Plan of Care (Signed)
  Problem: Education: Goal: Ability to verbalize activity precautions or restrictions will improve Outcome: Adequate for Discharge Goal: Knowledge of the prescribed therapeutic regimen will improve Outcome: Adequate for Discharge Goal: Understanding of discharge needs will improve Outcome: Adequate for Discharge   Problem: Activity: Goal: Ability to avoid complications of mobility impairment will improve Outcome: Adequate for Discharge Goal: Ability to tolerate increased activity will improve Outcome: Adequate for Discharge Goal: Will remain free from falls Outcome: Adequate for Discharge   Problem: Bowel/Gastric: Goal: Gastrointestinal status for postoperative course will improve Outcome: Adequate for Discharge   Problem: Clinical Measurements: Goal: Ability to maintain clinical measurements within normal limits will improve Outcome: Adequate for Discharge Goal: Postoperative complications will be avoided or minimized Outcome: Adequate for Discharge Goal: Diagnostic test results will improve Outcome: Adequate for Discharge   Problem: Pain Management: Goal: Pain level will decrease Outcome: Adequate for Discharge   Problem: Skin Integrity: Goal: Will show signs of wound healing Outcome: Adequate for Discharge   Problem: Health Behavior/Discharge Planning: Goal: Identification of resources available to assist in meeting health care needs will improve Outcome: Adequate for Discharge   Problem: Bladder/Genitourinary: Goal: Urinary functional status for postoperative course will improve Outcome: Adequate for Discharge   

## 2018-01-04 NOTE — Discharge Summary (Signed)
Physician Discharge Summary  Patient ID: Lindsey Hill MRN: [id number] DOB/AGE: 66-Jun-1952 66 y.o.  Admit date: 12/31/2017 Discharge date: 01/04/2018  Admission Diagnoses: Lumbar stenosis with neurogenic claudication  Discharge Diagnoses:  Active Problems:   Lumbar stenosis with neurogenic claudication  Discharged Condition: good  Hospital Course: 67 year old female with hx of previous  L4-5 PLIF ~ 9 mos ago, chronic back and leg pain, HTN, asthma, arthritis, joint replacement right thumb; MRI and myelo CT showed degenerative disc disease and stenosis at L3-4 and L5-S1, pt decided to proceed with surgery and is now s/p PLIF L3-4, L5-S1 performed on 12/31/17.  Consults: None  Significant Diagnostic Studies: radiology: X-Ray:  Four intraoperative fluoroscopic images of the lower lumbar spine demonstrate the patient is status post surgical posterior fusion of L3-4, L4-5 and L5-S1 with bilateral intrapedicular screw placement and interbody fusion.   Discharge Exam: Blood pressure 120/73, pulse 89, temperature 99.1 F (37.3 C), resp. rate 16, height 5\' 2"  (1.575 m), weight 63.2 kg, SpO2 93 %.   Physical Exam. Moving LE well. Strength is 5/5. Radicular pain is resolving. Slight old sanguinous drainage at incision. Honeycomb removed and steri strips changed. Patient reports feeling well and is ready to return home. She has a 3.5 hour trip home and is requesting a dose of her medication before her trip home.  Disposition:    Allergies as of 01/04/2018      Reactions   Phoslo [calcium Acetate] Other (See Comments)   Body aches    Shellfish Allergy Hives   Sulfa Antibiotics Hives   Mobic [meloxicam] Nausea Only      Medication List    STOP taking these medications   oxyCODONE-acetaminophen 5-325 MG tablet Commonly known as:  PERCOCET/ROXICET     TAKE these medications   aspirin EC 81 MG tablet Take 81 mg daily by mouth.   buPROPion 300 MG 24 hr tablet Commonly known as:   WELLBUTRIN XL Take 300 mg by mouth daily.   CARTIA XT 300 MG 24 hr capsule Generic drug:  diltiazem Take 300 mg every evening by mouth.   cyclobenzaprine 10 MG tablet Commonly known as:  FLEXERIL Take 1 tablet (10 mg total) by mouth 3 (three) times daily as needed for muscle spasms.   dicyclomine 20 MG tablet Commonly known as:  BENTYL Take 20 mg daily as needed by mouth (upset stomach).   docusate sodium 100 MG capsule Commonly known as:  COLACE Take 1 capsule (100 mg total) by mouth 2 (two) times daily.   fenofibrate micronized 134 MG capsule Commonly known as:  LOFIBRA Take 134 mg every evening by mouth.   FLUoxetine 40 MG capsule Commonly known as:  PROZAC Take 40 mg by mouth daily.   HYDROcodone-acetaminophen 10-325 MG tablet Commonly known as:  NORCO Take 1 tablet by mouth 3 (three) times daily.   loratadine 10 MG tablet Commonly known as:  CLARITIN Take 10 mg daily as needed by mouth for allergies or rhinitis.   Melatonin 10 MG Caps Take 10 mg at bedtime by mouth.   naproxen sodium 220 MG tablet Commonly known as:  ALEVE Take 220 mg by mouth daily as needed (pain).   Oxycodone HCl 10 MG Tabs Take 1 tablet (10 mg total) by mouth every 3 (three) hours as needed for severe pain ((score 7 to 10)).   pantoprazole 40 MG tablet Commonly known as:  PROTONIX Take 40 mg by mouth daily.   rosuvastatin 20 MG tablet Commonly known as:  CRESTOR Take 20 mg every evening by mouth.   Vitamin D 2000 units Caps Take 2,000 Units daily by mouth.        Signed: Floreen Comber 01/04/2018, 11:01 AM

## 2018-01-04 NOTE — Care Management Important Message (Signed)
Important Message  Patient Details  Name: MARSHAWN NINNEMAN MRN: [id number] Date of Birth: [birth year]   Medicare Important Message Given:  Yes    Dorena Bodo 01/04/2018, 3:49 PM

## 2018-01-05 MED FILL — Sodium Chloride IV Soln 0.9%: INTRAVENOUS | Qty: 1000 | Status: AC

## 2018-01-05 MED FILL — Heparin Sodium (Porcine) Inj 1000 Unit/ML: INTRAMUSCULAR | Qty: 30 | Status: AC

## 2018-01-06 NOTE — Care Management (Signed)
Lindsey Hill from [facility] in [place] needs the referral to be fax to them at [phone] Rita's number is [phone] Original referal sent to the wrong place.

## 2019-09-12 IMAGING — CR DG MYELOGRAPHY LUMBAR INJ MULTI REGION
12 of 24 series · 12 of 24 positions shown · non-contrast
Comparison: none

CLINICAL DATA: Cervical and lumbar spondylosis without myelopathy.
Left C4 radiculopathy. Bilateral lower extremity pain radiating into
the left greater than right posterior thighs.
TECHNIQUE: Contiguous axial images were obtained through the cervical and
lumbar spine after the intrathecal infusion of infusion. Coronal and
sagittal reconstructions were obtained of the axial image sets.

[w cervical spine lat]
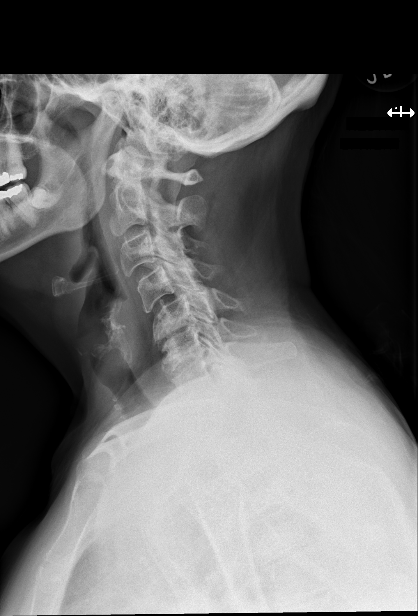

[w cervical spine flexion]
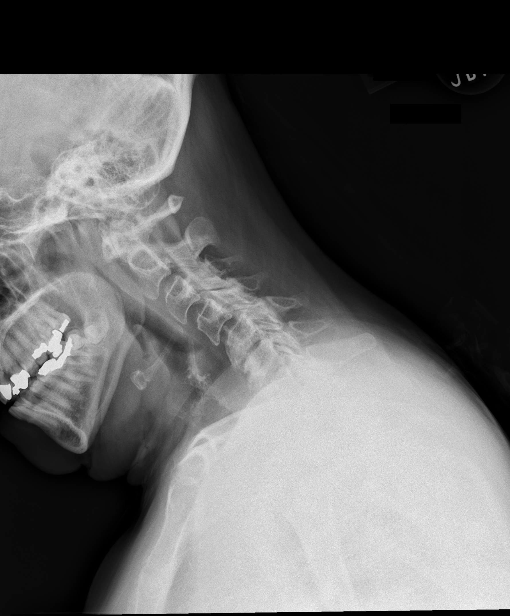

[w cervical spine extension]
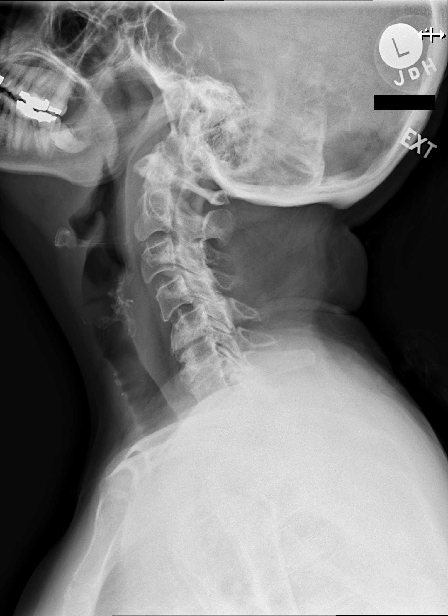

[w lumbar spine lat]
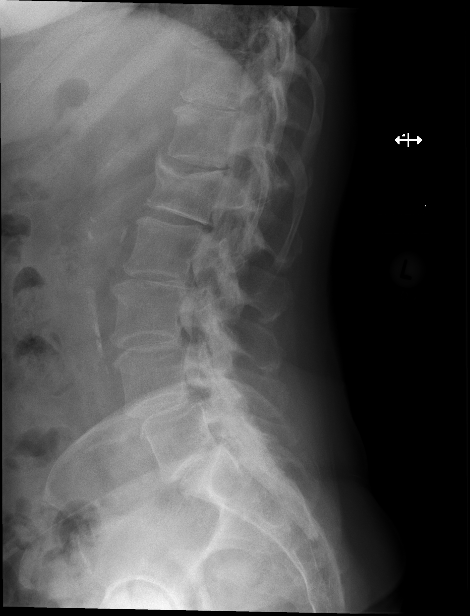

[w lumbar spine flexion]
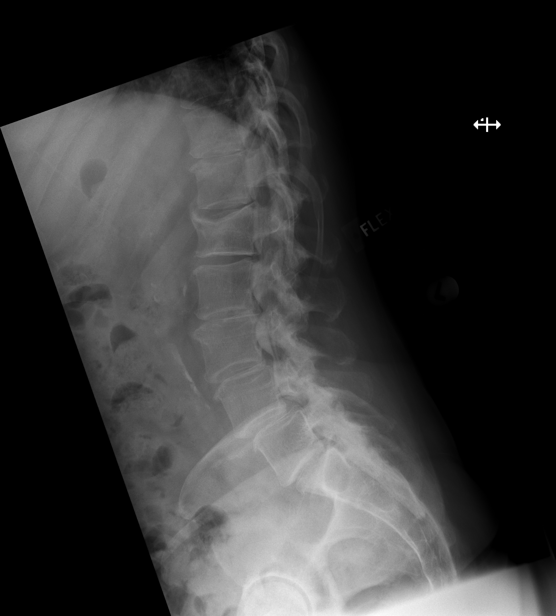

[w lumbar spine extension]
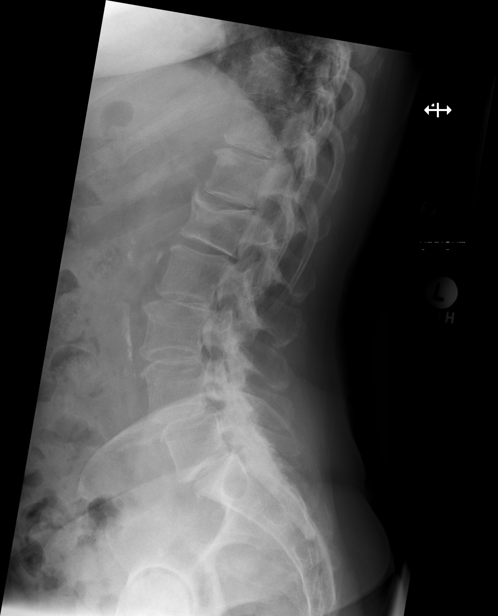

[vasc standard (1 of 6)]
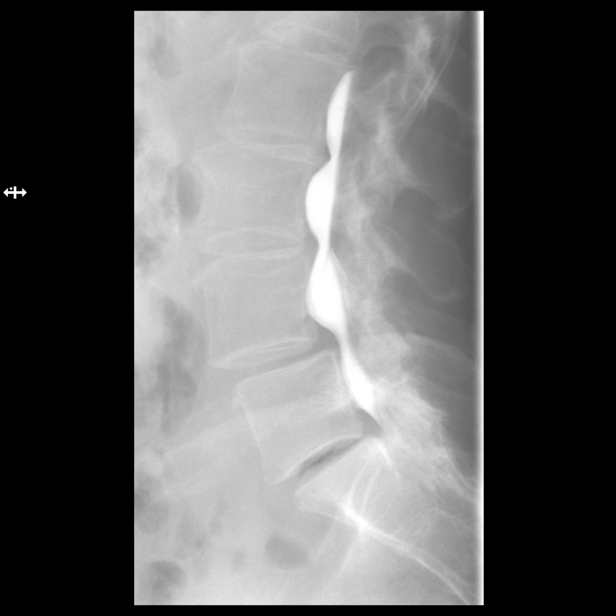

[vasc standard (2 of 6)]
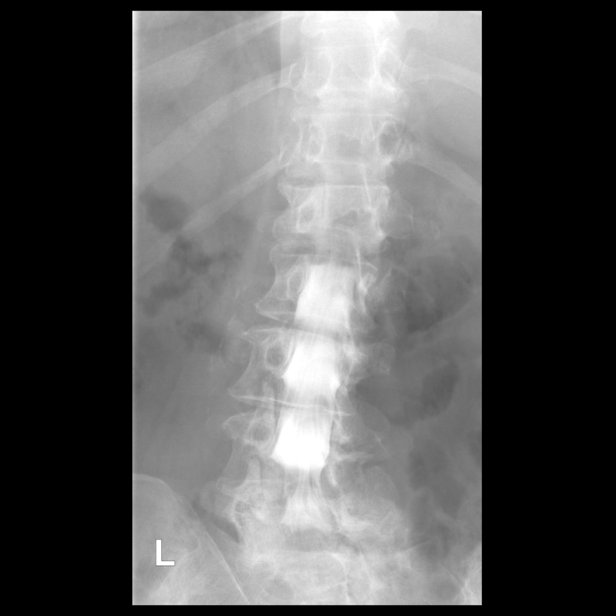

[vasc standard (3 of 6)]
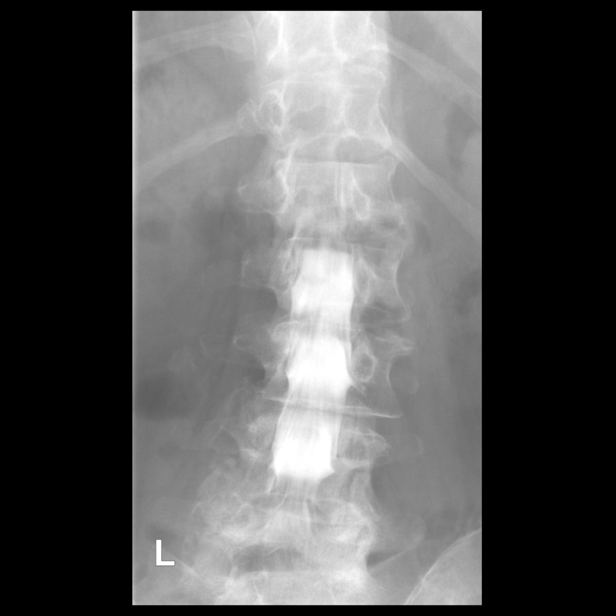

[vasc standard (4 of 6)]
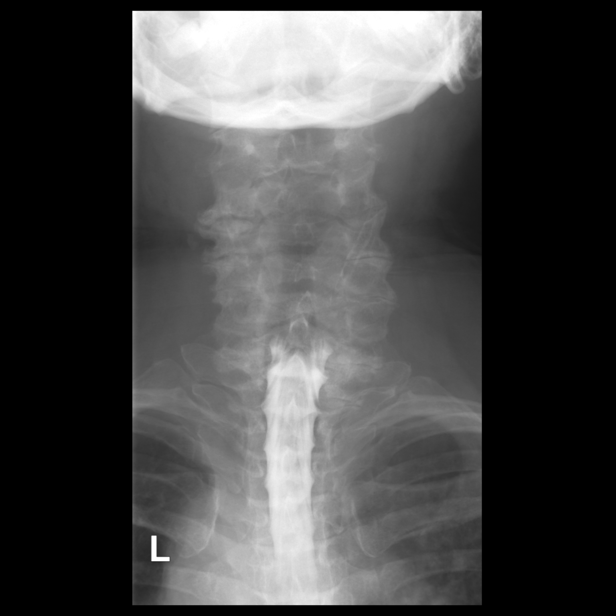

[vasc standard (5 of 6)]
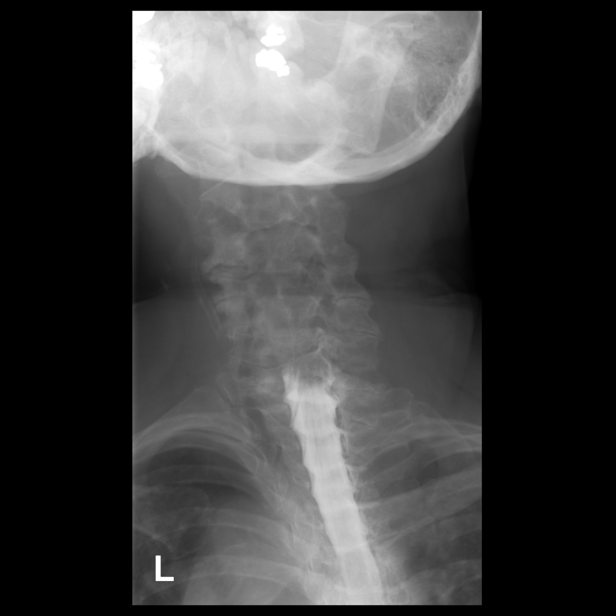

[vasc standard (6 of 6)]
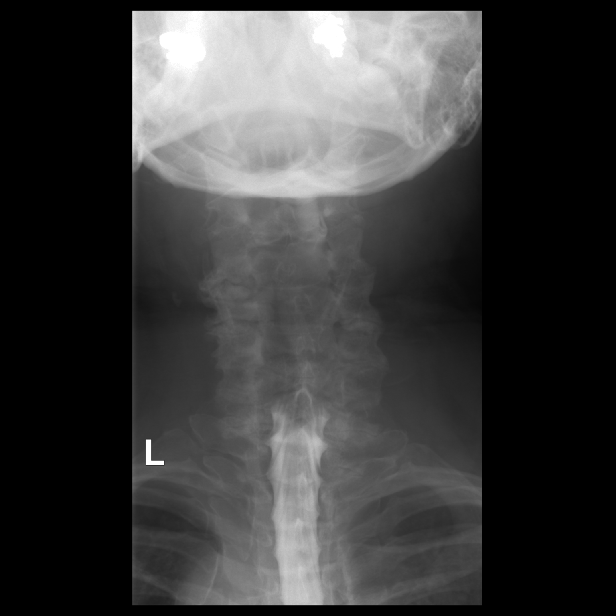

[12 of 24 positions shown; findings below may reference images not displayed]

FLUOROSCOPY TIME:  Radiation Exposure Index (as provided by the
fluoroscopic device): 471.32 ?Gy*m2

Fluoroscopy Time:  50 seconds.

Number of Acquired Images:  28

PROCEDURE:
LUMBAR PUNCTURE FOR CERVICAL AND LUMBAR MYELOGRAM

CERVICAL AND LUMBAR MYELOGRAM

CT CERVICAL MYELOGRAM

CT LUMBAR MYELOGRAM

After thorough discussion of risks and benefits of the procedure
including bleeding, infection, injury to nerves, blood vessels,
adjacent structures as well as headache and CSF leak, written and
oral informed consent was obtained. Consent was obtained by Dr.
Nini Pizano.

Patient was positioned prone on the fluoroscopy table. Local
anesthesia was provided with 1% lidocaine without epinephrine after
prepped and draped in the usual sterile fashion. Puncture was
performed at L3-L4 using a 3 1/2 inch 22-gauge spinal needle via
left paramedian right approach. Using a single pass through the
dura, the needle was placed within the thecal sac, with return of
clear CSF. 10 mL Isovue U-[facility] was injected into the thecal sac, with
normal opacification of the nerve roots and cauda equina consistent
with free flow within the subarachnoid space. The patient was then
moved to the trendelenburg position and contrast flowed into the
cervical spine regions.

I personally performed the lumbar puncture and administered the
intrathecal contrast. I also personally supervised acquisition of
the myelogram images.
FINDINGS: CERVICAL AND LUMBAR MYELOGRAM FINDINGS:

Cervical spine: There is no acute fracture or subluxation. Vertebral
body heights are preserved. Reversal of the normal cervical lordosis
centered at C5. There is 4 mm anterolisthesis of C4 on C5 which does
not change with flexion or extension. There is moderate to severe
disc height loss and endplate spurring at C5-C6 and C6-C7.
Intrathecal contrast initially would not extend above the C6 level,
but eventually reached in the posterior fossa.

Lumbar spine: There are 5 lumbar type vertebral bodies. There is no
acute fracture or subluxation. Chronic mild anterior compression
deformity of the L1 vertebral body. There is trace anterolisthesis
of L3 on L4 and L5 on S1. There is 6 mm anterolisthesis of L4 on L5.
This is unchanged with flexion, and slightly improves with
extension.

There is multilevel anterior endplate spurring. At L1-L2 and L2-L3,
there is disc height loss and small ventral extradural defects
without significant stenosis. At L3-L4 and L4-L5, there is disc
height loss and moderate ventral extradural defects resulting in
mild central spinal canal stenosis. Mild disc height loss at L5-S1
without stenosis.

CT CERVICAL MYELOGRAM FINDINGS:

Alignment: Reversal of the normal cervical lordosis centered at C5.
Again seen is 4 mm anterolisthesis of C4 on C5. There is trace
stepwise retrolisthesis of C5 on C6 and C6 on C7. There is 3 mm
anterolisthesis of C7 on T1.

Skull base and vertebrae: There is no acute fracture or focal bone
lesion.

Soft tissues and upper chest: Small calcification in the right
thyroid lobe. Otherwise negative.

Disc levels:

C2-C3: Small central disc protrusion mildly effacing the ventral
thecal sac. Mild left facet arthropathy. No spinal canal or
neuroforaminal stenosis.

C3-C4: Advanced left facet uncovertebral hypertrophy resulting in
severe left neuroforaminal stenosis. No spinal canal or right
neuroforaminal stenosis.

C4-C5: Advanced left facet uncovertebral hypertrophy resulting in
moderate left neuroforaminal stenosis. No significant central spinal
canal or right neuroforaminal stenosis.

C5-C6: Severe disc height loss with posterior disc osteophyte
complex and bilateral facet uncovertebral hypertrophy resulting in
flattening of the left aspect of the ventral cord and mild left
spinal canal stenosis. There is moderate bilateral neuroforaminal
stenosis.

C6-C7: Severe disc height loss with posterior disc osteophyte
complex, asymmetric to the right, and moderate bilateral
uncovertebral hypertrophy resulting in moderate central spinal canal
stenosis with flattening of the ventral cord, right greater than
left, and severe bilateral neuroforaminal stenosis.

C7-T1: Severe bilateral facet arthropathy. No significant spinal
canal or neuroforaminal stenosis.

No significant central spinal canal or neuroforaminal stenosis in
the visualized upper thoracic spine.

CT LUMBAR MYELOGRAM FINDINGS:

Segmentation: Standard.

Alignment: Slight dextroscoliosis centered at L2. There is a 6 mm
anterolisthesis of L4 on L5 and 3 mm anterolisthesis of L5 on S1.

Vertebrae: No acute fracture. Chronic mild compression deformity of
the L1 vertebral body.

Paraspinal and other soft tissues: Aortic atherosclerosis. Otherwise
negative.

Disc levels:

T12-L1:  Negative.

L1-L2: Small diffuse disc bulge without spinal canal or
neuroforaminal stenosis.

L2-L3: Small diffuse disc bulge mildly narrowing the left lateral
recess. No central spinal canal or neuroforaminal stenosis.

L3-L4: Small diffuse disc bulge and mild bilateral facet arthropathy
with ligamentum flavum hypertrophy resulting in mild central spinal
canal stenosis and mild left neuroforaminal stenosis. No right
neuroforaminal stenosis.

L4-L5: Moderate diffuse disc bulge and mild bilateral facet
arthropathy with ligamentum flavum hypertrophy resulting in
mild-to-moderate central spinal canal stenosis, mild bilateral
lateral recess narrowing, and moderate right neuroforaminal
stenosis. No left neuroforaminal stenosis.

L5-S1: Disc height loss and disc uncovering with moderate bilateral
facet arthropathy resulting in mild right neuroforaminal stenosis.
No central spinal canal or left neuroforaminal stenosis.
IMPRESSION: CERVICAL MYELOGRAM:

1. Multilevel degenerative changes of the cervical spine, as
described above, worst at C6-C7 where there is moderate central
spinal canal stenosis and severe bilateral neuroforaminal stenosis.
2. Mild spinal canal and moderate bilateral neuroforaminal stenosis
at C5-C6.
3. Advanced left facet uncovertebral hypertrophy resulting in severe
left neuroforaminal stenosis at C3-C4 and moderate neuroforaminal
stenosis at C4-C5, which could impinge the exiting left C4 and C5
nerve roots and account for the patient's radiculopathy.
4. Grade 1 anterolisthesis at C4-C5 and C7-T1 due to severe
bilateral facet arthropathy.
LUMBAR MYELOGRAM:

1. Multilevel degenerative changes of the lumbar spine as described
above, worst at L4-L5 where there is mild to moderate central spinal
canal stenosis and moderate right neuroforaminal stenosis.
2. Mild central spinal canal stenosis at L3-L4. Mild neuroforaminal
stenosis on the left at L3-L4 and on the right at L5-S1.
3. Grade 1 anterolisthesis of L4 on L5, unchanged with flexion, and
slightly improved with extension.

## 2020-02-08 ENCOUNTER — Other Ambulatory Visit: Payer: Self-pay | Admitting: Neurosurgery

## 2020-03-07 ENCOUNTER — Encounter (HOSPITAL_COMMUNITY): Payer: Self-pay | Admitting: Vascular Surgery

## 2020-03-07 NOTE — Progress Notes (Signed)
Anesthesia Chart Review: Lindsey Hill    Case: [id number] Date/Time: 03/12/20 1232   Procedure: ANTERIOR CERVICAL DECOMPRESSION/DISCECTOMY FUSION, INTERBODY PROSTHESIS, PLATE/SCREWS B5-1, C5-6, C6-7 (N/A ) - 3C   Anesthesia type: General   Pre-op diagnosis: CERVICAL SPONDYLOSIS WITH MYELOPATHY AND RADICULOPATHY   Location: [facility] ROOM 19 / [facility]   Surgeons: Tressie Stalker, MD      DISCUSSION: Patient is a 69 year old female scheduled for the above procedure. She lived in [place] and is a same day work-up.  History includes former smoker, HTN, asthma ("mild"), GERD, murmur ("had it forever"; reported no echo ever recommended). She is s/p L4-5 fusion 02/05/17 and s/p L3-4 and L5-S1 transforaminal lumbar interbody fusion, posterior segmental instrumentation from L3-S1 with globus titanium pedicle screws and rods, posterior lateral arthrodesis at L3-4, L4-5 and L5-S1 12/31/17.  Received Pfizer COVID-19 vaccine 12/08/19.   PAT RN to contact patient for pre-operative interview/teaching. If she has not had recent labs or EKG within the past year, then those will need to be done on the day of surgery.    VS: For day of surgery.   PROVIDERS: PCP is Marcia Brash, DO with [facility] in [place] (01/31/20 televisit note in Care Everywhere) Maralyn Sago, MD is pulmonologist [facility] in [place])   LABS: For day of surgery.   Spirometry 10/25/15 ([facility]): FVC 3.06 (107%), FEV1 2.40 (109%), FEV1/FVC 78% (102%)   EKG: Last EKG currently available is from 02/02/17 and shows [facility]; scanned under Media tab, Correspondence, 02/05/17)   CV: N/A  Past Medical History:  Diagnosis Date  . Anxiety   . Arthritis   . Asthma    "mild"  . Depression   . GERD (gastroesophageal reflux disease)   . Headache   . Heart murmur    "had it forever"   . Hypertension   . Pneumonia   . Spondylosis of lumbosacral region   . Wears  glasses     Past Surgical History:  Procedure Laterality Date  . CESAREAN SECTION    . COLONOSCOPY    . DG THUMB RIGHT HAND ([facility] HX)     joint replacement  . rectal fistula repair    . TONSILLECTOMY    . TUBAL LIGATION      MEDICATIONS: No current facility-administered medications for this encounter.   Marland Kitchen aspirin EC 81 MG tablet  . Biotin 5000 MCG TABS  . buPROPion (WELLBUTRIN XL) 300 MG 24 hr tablet  . cholecalciferol (VITAMIN D) 25 MCG (1000 UNIT) tablet  . dicyclomine (BENTYL) 20 MG tablet  . diltiazem (CARDIZEM CD) 300 MG 24 hr capsule  . fenofibrate micronized (LOFIBRA) 134 MG capsule  . FLUoxetine (PROZAC) 40 MG capsule  . HYDROcodone-acetaminophen (NORCO) 10-325 MG tablet  . Melatonin 10 MG CAPS  . milk thistle 175 MG tablet  . Misc Natural Products (COLON HERBAL CLEANSER PO)  . naproxen sodium (ALEVE) 220 MG tablet  . neomycin-polymyxin-dexamethasone (MAXITROL) 0.1 % ophthalmic suspension  . pantoprazole (PROTONIX) 40 MG tablet  . polyethylene glycol (MIRALAX / GLYCOLAX) 17 g packet  . rosuvastatin (CRESTOR) 20 MG tablet  . senna (SENOKOT) 8.6 MG TABS tablet  . loratadine (CLARITIN) 10 MG tablet    Shonna Chock, PA-C Surgical Short Stay/Anesthesiology [facility] Phone [phone] [facility] Phone [phone] 03/07/2020 7:26 PM

## 2020-03-07 NOTE — Anesthesia Preprocedure Evaluation (Deleted)
Anesthesia Evaluation    Reviewed: Allergy & Precautions, Patient's Chart, lab work & pertinent test results  Airway        Dental   Pulmonary neg pulmonary ROS, former smoker,           Cardiovascular hypertension, negative cardio ROS       Neuro/Psych  Headaches, PSYCHIATRIC DISORDERS Anxiety Depression    GI/Hepatic Neg liver ROS, GERD  Medicated,  Endo/Other  negative endocrine ROS  Renal/GU negative Renal ROS     Musculoskeletal  (+) Arthritis ,   Abdominal   Peds  Hematology HLD   Anesthesia Other Findings CERVICAL SPONDYLOSIS WITH MYELOPATHY AND RADICULOPATHY  Reproductive/Obstetrics                            Anesthesia Physical Anesthesia Plan  ASA: II  Anesthesia Plan: General   Post-op Pain Management:    Induction: Intravenous  PONV Risk Score and Plan: 3 and Ondansetron, Dexamethasone, Midazolam and Treatment may vary due to age or medical condition  Airway Management Planned: Oral ETT and Video Laryngoscope Planned  Additional Equipment:   Intra-op Plan:   Post-operative Plan: Extubation in OR  Informed Consent:   Plan Discussed with:   Anesthesia Plan Comments: (Reviewed PAT note written by Shonna Chock, PA-C. )       Anesthesia Quick Evaluation

## 2020-03-08 ENCOUNTER — Other Ambulatory Visit: Payer: Self-pay

## 2020-03-08 ENCOUNTER — Encounter (HOSPITAL_COMMUNITY): Payer: Self-pay | Admitting: Neurosurgery

## 2020-03-08 NOTE — Progress Notes (Signed)
methicillin-resistant Staph aureus. Bley denies chest pain or shortness of breath. Patient denies any s/s of Covid in her household and has not been in contact with anyone with Covid to her knowledge.  Ms Graham will arrive 3 hours early to be tested for Covid on 03/12/20.  I instructed Mrs. Violet to hold all vitamins, herbal products and NSAIDS. Patient said that he has to take the herbal laxative or she will be bee have a bowel blockage again. Ms Lichtenberger had a bowel blockage last year, dte to opioids.  Mrs Buege denies seeing a cardiologist or having any heart studies, patient will have an EKG on 03/13/19.

## 2020-03-12 ENCOUNTER — Encounter (HOSPITAL_COMMUNITY)
Admission: RE | Disposition: A | Discharge status: Home / Self Care | Payer: Self-pay | Source: Home / Self Care | Attending: Neurosurgery

## 2020-03-12 ENCOUNTER — Ambulatory Visit (HOSPITAL_COMMUNITY)
Admission: RE | Admit: 2020-03-12 | Discharge: 2020-03-12 | Disposition: A | Discharge status: Home / Self Care | Payer: Medicare Other | Attending: Neurosurgery | Admitting: Neurosurgery

## 2020-03-12 ENCOUNTER — Encounter (HOSPITAL_COMMUNITY): Payer: Self-pay | Admitting: Neurosurgery

## 2020-03-12 DIAGNOSIS — R011 Cardiac murmur, unspecified: Secondary | ICD-10-CM | POA: Diagnosis not present

## 2020-03-12 DIAGNOSIS — K219 Gastro-esophageal reflux disease without esophagitis: Secondary | ICD-10-CM | POA: Diagnosis not present

## 2020-03-12 DIAGNOSIS — Z79891 Long term (current) use of opiate analgesic: Secondary | ICD-10-CM | POA: Insufficient documentation

## 2020-03-12 DIAGNOSIS — U071 COVID-19: Secondary | ICD-10-CM | POA: Diagnosis not present

## 2020-03-12 DIAGNOSIS — I10 Essential (primary) hypertension: Secondary | ICD-10-CM | POA: Diagnosis not present

## 2020-03-12 DIAGNOSIS — Z981 Arthrodesis status: Secondary | ICD-10-CM | POA: Diagnosis not present

## 2020-03-12 DIAGNOSIS — Z7982 Long term (current) use of aspirin: Secondary | ICD-10-CM | POA: Diagnosis not present

## 2020-03-12 DIAGNOSIS — Z79899 Other long term (current) drug therapy: Secondary | ICD-10-CM | POA: Insufficient documentation

## 2020-03-12 DIAGNOSIS — M4712 Other spondylosis with myelopathy, cervical region: Secondary | ICD-10-CM | POA: Diagnosis not present

## 2020-03-12 DIAGNOSIS — Z87891 Personal history of nicotine dependence: Secondary | ICD-10-CM | POA: Insufficient documentation

## 2020-03-12 DIAGNOSIS — M4722 Other spondylosis with radiculopathy, cervical region: Secondary | ICD-10-CM | POA: Diagnosis not present

## 2020-03-12 DIAGNOSIS — Z01818 Encounter for other preprocedural examination: Secondary | ICD-10-CM | POA: Diagnosis present

## 2020-03-12 HISTORY — DX: Unspecified injury of head, initial encounter: S09.90XA

## 2020-03-12 LAB — SARS CORONAVIRUS 2 BY RT PCR (HOSPITAL ORDER, PERFORMED IN ~~LOC~~ HOSPITAL LAB): SARS Coronavirus 2: POSITIVE — AB

## 2020-03-12 SURGERY — ANTERIOR CERVICAL DECOMPRESSION/DISCECTOMY FUSION 3 LEVELS
Anesthesia: General

## 2020-03-12 SURGERY — Surgical Case
Anesthesia: *Unknown

## 2020-03-12 MED ORDER — FENTANYL CITRATE (PF) 250 MCG/5ML IJ SOLN
INTRAMUSCULAR | Status: AC
  Filled 2020-03-12: qty 5

## 2020-03-12 MED ORDER — SODIUM CHLORIDE 0.9 % IV SOLN: INTRAVENOUS | Status: DC

## 2020-03-12 MED ORDER — ROCURONIUM BROMIDE 10 MG/ML (PF) SYRINGE
PREFILLED_SYRINGE | INTRAVENOUS | Status: AC
  Filled 2020-03-12: qty 10

## 2020-03-12 MED ORDER — DEXAMETHASONE SODIUM PHOSPHATE 10 MG/ML IJ SOLN
INTRAMUSCULAR | Status: AC
  Filled 2020-03-12: qty 1

## 2020-03-12 MED ORDER — LIDOCAINE 2% (20 MG/ML) 5 ML SYRINGE
INTRAMUSCULAR | Status: AC
  Filled 2020-03-12: qty 5

## 2020-03-12 MED ORDER — CHLORHEXIDINE GLUCONATE 0.12 % MT SOLN
15.0000 mL | Freq: Once | OROMUCOSAL | Status: DC
  Filled 2020-03-12: qty 15

## 2020-03-12 MED ORDER — PROPOFOL 10 MG/ML IV BOLUS
INTRAVENOUS | Status: AC
  Filled 2020-03-12: qty 20

## 2020-03-12 MED ORDER — ORAL CARE MOUTH RINSE: 15.0000 mL | Freq: Once | OROMUCOSAL | Status: DC

## 2020-03-12 MED ORDER — CHLORHEXIDINE GLUCONATE CLOTH 2 % EX PADS: 6.0000 | MEDICATED_PAD | Freq: Once | CUTANEOUS | Status: DC

## 2020-03-12 MED ORDER — CEFAZOLIN SODIUM-DEXTROSE 2-4 GM/100ML-% IV SOLN
2.0000 g | INTRAVENOUS | Status: DC
  Filled 2020-03-12: qty 100

## 2020-03-12 MED ORDER — ONDANSETRON HCL 4 MG/2ML IJ SOLN
INTRAMUSCULAR | Status: AC
  Filled 2020-03-12: qty 2

## 2020-03-12 MED ORDER — MIDAZOLAM HCL 2 MG/2ML IJ SOLN
INTRAMUSCULAR | Status: AC
  Filled 2020-03-12: qty 2

## 2020-03-12 NOTE — Progress Notes (Signed)
Patient's COVID test resulted positive.  Dr. Lovell Sheehan notified.  Surgery cancelled.  OR desk and Dr. Bradley Ferris made aware that Dr. Lovell Sheehan is cancelling surgery.  Dr. Lovell Sheehan stated for patient to contact his office to get rescheduled.

## 2020-03-12 NOTE — Progress Notes (Signed)
Patient's IV removed, catheter in tact.  Patient discharged home with husband.

## 2020-04-03 ENCOUNTER — Other Ambulatory Visit: Payer: Self-pay | Admitting: Neurosurgery

## 2020-04-26 ENCOUNTER — Encounter (HOSPITAL_COMMUNITY): Payer: Self-pay | Admitting: Neurosurgery

## 2020-04-26 NOTE — Progress Notes (Signed)
EKG: 03/13/19 CXR:na ECHO: denies Stress Test: denies Cardiac Cath: denies  Fasting Blood Sugar- na Checks Blood Sugar_na__ times a day  OSA/CPAP:  No  ASA/Blood Thinners:  No  Covid positive 03/12/20.  No need to retest.   Anesthesia Review:  No  Patient denies shortness of breath, fever, cough, and chest pain at PAT appointment.  Patient verbalized understanding of instructions provided today at the PAT appointment.  Patient asked to review instructions at home and day of surgery.

## 2020-04-30 ENCOUNTER — Inpatient Hospital Stay (HOSPITAL_COMMUNITY): Payer: Medicare Other

## 2020-04-30 ENCOUNTER — Inpatient Hospital Stay (HOSPITAL_COMMUNITY)
Admission: RE | Admit: 2020-04-30 | Discharge: 2020-05-01 | DRG: 472 | Disposition: A | Discharge status: Home / Self Care | Payer: Medicare Other | Attending: Neurosurgery | Admitting: Neurosurgery

## 2020-04-30 ENCOUNTER — Encounter (HOSPITAL_COMMUNITY)
Admission: RE | Disposition: A | Discharge status: Home / Self Care | Payer: Self-pay | Source: Home / Self Care | Attending: Neurosurgery

## 2020-04-30 ENCOUNTER — Other Ambulatory Visit: Payer: Self-pay

## 2020-04-30 ENCOUNTER — Encounter (HOSPITAL_COMMUNITY): Payer: Self-pay | Admitting: Neurosurgery

## 2020-04-30 ENCOUNTER — Inpatient Hospital Stay (HOSPITAL_COMMUNITY): Payer: Medicare Other | Admitting: Anesthesiology

## 2020-04-30 DIAGNOSIS — K219 Gastro-esophageal reflux disease without esophagitis: Secondary | ICD-10-CM | POA: Diagnosis present

## 2020-04-30 DIAGNOSIS — Z981 Arthrodesis status: Secondary | ICD-10-CM

## 2020-04-30 DIAGNOSIS — Z91013 Allergy to seafood: Secondary | ICD-10-CM | POA: Diagnosis not present

## 2020-04-30 DIAGNOSIS — Z79890 Hormone replacement therapy (postmenopausal): Secondary | ICD-10-CM

## 2020-04-30 DIAGNOSIS — M4802 Spinal stenosis, cervical region: Principal | ICD-10-CM | POA: Diagnosis present

## 2020-04-30 DIAGNOSIS — M4312 Spondylolisthesis, cervical region: Secondary | ICD-10-CM | POA: Diagnosis present

## 2020-04-30 DIAGNOSIS — Z888 Allergy status to other drugs, medicaments and biological substances status: Secondary | ICD-10-CM

## 2020-04-30 DIAGNOSIS — I10 Essential (primary) hypertension: Secondary | ICD-10-CM | POA: Diagnosis present

## 2020-04-30 DIAGNOSIS — Z7982 Long term (current) use of aspirin: Secondary | ICD-10-CM

## 2020-04-30 DIAGNOSIS — M5001 Cervical disc disorder with myelopathy,  high cervical region: Secondary | ICD-10-CM | POA: Diagnosis present

## 2020-04-30 DIAGNOSIS — M4712 Other spondylosis with myelopathy, cervical region: Secondary | ICD-10-CM | POA: Diagnosis present

## 2020-04-30 DIAGNOSIS — M5011 Cervical disc disorder with radiculopathy,  high cervical region: Secondary | ICD-10-CM | POA: Diagnosis present

## 2020-04-30 DIAGNOSIS — Z79899 Other long term (current) drug therapy: Secondary | ICD-10-CM | POA: Diagnosis not present

## 2020-04-30 DIAGNOSIS — Z8701 Personal history of pneumonia (recurrent): Secondary | ICD-10-CM

## 2020-04-30 DIAGNOSIS — F419 Anxiety disorder, unspecified: Secondary | ICD-10-CM | POA: Diagnosis present

## 2020-04-30 DIAGNOSIS — Z8249 Family history of ischemic heart disease and other diseases of the circulatory system: Secondary | ICD-10-CM | POA: Diagnosis not present

## 2020-04-30 DIAGNOSIS — J45909 Unspecified asthma, uncomplicated: Secondary | ICD-10-CM | POA: Diagnosis present

## 2020-04-30 DIAGNOSIS — Z87891 Personal history of nicotine dependence: Secondary | ICD-10-CM

## 2020-04-30 DIAGNOSIS — Z882 Allergy status to sulfonamides status: Secondary | ICD-10-CM | POA: Diagnosis not present

## 2020-04-30 DIAGNOSIS — F32A Depression, unspecified: Secondary | ICD-10-CM | POA: Diagnosis present

## 2020-04-30 DIAGNOSIS — M4722 Other spondylosis with radiculopathy, cervical region: Secondary | ICD-10-CM | POA: Diagnosis present

## 2020-04-30 DIAGNOSIS — Z9851 Tubal ligation status: Secondary | ICD-10-CM | POA: Diagnosis not present

## 2020-04-30 DIAGNOSIS — Z419 Encounter for procedure for purposes other than remedying health state, unspecified: Secondary | ICD-10-CM

## 2020-04-30 HISTORY — PX: ANTERIOR CERVICAL DECOMP/DISCECTOMY FUSION: SHX1161

## 2020-04-30 LAB — BASIC METABOLIC PANEL
Anion gap: 11 (ref 5–15)
BUN: 18 mg/dL (ref 8–23)
CO2: 19 mmol/L — ABNORMAL LOW (ref 22–32)
Calcium: 9.6 mg/dL (ref 8.9–10.3)
Chloride: 109 mmol/L (ref 98–111)
Creatinine, Ser: 0.81 mg/dL (ref 0.44–1.00)
GFR, Estimated: >60 mL/min (ref >60)
Glucose, Bld: 90 mg/dL (ref 70–99)
Potassium: 5.9 mmol/L — ABNORMAL HIGH (ref 3.5–5.1)
Sodium: 139 mmol/L (ref 135–145)

## 2020-04-30 LAB — CBC
HCT: 40.9 % (ref 36.0–46.0)
Hemoglobin: 12.3 g/dL (ref 12.0–15.0)
MCH: 30.4 pg (ref 26.0–34.0)
MCHC: 30.1 g/dL (ref 30.0–36.0)
MCV: 101 fL — ABNORMAL HIGH (ref 80.0–100.0)
Platelets: 288 10*3/uL (ref 150–400)
RBC: 4.05 MIL/uL (ref 3.87–5.11)
RDW: 14.6 % (ref 11.5–15.5)
WBC: 4.8 10*3/uL (ref 4.0–10.5)
nRBC: 0 % (ref 0.0–0.2)

## 2020-04-30 LAB — TYPE AND SCREEN
ABO/RH(D): A POS
Antibody Screen: NEGATIVE

## 2020-04-30 LAB — SURGICAL PCR SCREEN
MRSA, PCR: NEGATIVE
Staphylococcus aureus: NEGATIVE

## 2020-04-30 SURGERY — ANTERIOR CERVICAL DECOMPRESSION/DISCECTOMY FUSION 3 LEVELS
Anesthesia: General | Site: Spine Cervical

## 2020-04-30 MED ORDER — BUPROPION HCL ER (XL) 300 MG PO TB24
300.0000 mg | ORAL_TABLET | Freq: Every day | ORAL | Status: DC
  Administered 2020-05-01: 300 mg via ORAL
  Filled 2020-04-30: qty 1

## 2020-04-30 MED ORDER — SUGAMMADEX SODIUM 200 MG/2ML IV SOLN
INTRAVENOUS | Status: DC | PRN
  Administered 2020-04-30: 200 mg via INTRAVENOUS

## 2020-04-30 MED ORDER — MORPHINE SULFATE (PF) 4 MG/ML IV SOLN
4.0000 mg | INTRAVENOUS | Status: DC | PRN
  Administered 2020-04-30: 4 mg via INTRAVENOUS
  Filled 2020-04-30: qty 1

## 2020-04-30 MED ORDER — PHENOL 1.4 % MT LIQD
1.0000 | OROMUCOSAL | Status: DC | PRN
  Filled 2020-04-30: qty 177

## 2020-04-30 MED ORDER — THROMBIN 5000 UNITS EX SOLR
OROMUCOSAL | Status: DC | PRN
  Administered 2020-04-30: 5 mL via TOPICAL

## 2020-04-30 MED ORDER — ACETAMINOPHEN 325 MG PO TABS
650.0000 mg | ORAL_TABLET | ORAL | Status: DC | PRN
  Administered 2020-05-01: 650 mg via ORAL
  Filled 2020-04-30: qty 2

## 2020-04-30 MED ORDER — CHLORHEXIDINE GLUCONATE 0.12 % MT SOLN
15.0000 mL | Freq: Once | OROMUCOSAL | Status: AC
  Administered 2020-04-30: 15 mL via OROMUCOSAL

## 2020-04-30 MED ORDER — PHENYLEPHRINE 40 MCG/ML (10ML) SYRINGE FOR IV PUSH (FOR BLOOD PRESSURE SUPPORT)
PREFILLED_SYRINGE | INTRAVENOUS | Status: AC
  Filled 2020-04-30: qty 10

## 2020-04-30 MED ORDER — FENTANYL CITRATE (PF) 100 MCG/2ML IJ SOLN
INTRAMUSCULAR | Status: AC
  Administered 2020-04-30: 50 ug via INTRAVENOUS
  Filled 2020-04-30: qty 2

## 2020-04-30 MED ORDER — DEXAMETHASONE 4 MG PO TABS: 4.0000 mg | ORAL_TABLET | Freq: Four times a day (QID) | ORAL | Status: AC

## 2020-04-30 MED ORDER — LIDOCAINE 2% (20 MG/ML) 5 ML SYRINGE
INTRAMUSCULAR | Status: AC
  Filled 2020-04-30: qty 5

## 2020-04-30 MED ORDER — ROCURONIUM BROMIDE 10 MG/ML (PF) SYRINGE
PREFILLED_SYRINGE | INTRAVENOUS | Status: AC
  Filled 2020-04-30: qty 10

## 2020-04-30 MED ORDER — OXYCODONE HCL 5 MG PO TABS
10.0000 mg | ORAL_TABLET | ORAL | Status: DC | PRN
  Administered 2020-04-30 – 2020-05-01 (×4): 10 mg via ORAL
  Filled 2020-04-30 (×4): qty 2

## 2020-04-30 MED ORDER — 0.9 % SODIUM CHLORIDE (POUR BTL) OPTIME
TOPICAL | Status: DC | PRN
  Administered 2020-04-30: 1000 mL

## 2020-04-30 MED ORDER — CEFAZOLIN SODIUM-DEXTROSE 2-4 GM/100ML-% IV SOLN
2.0000 g | Freq: Three times a day (TID) | INTRAVENOUS | Status: AC
  Administered 2020-04-30 – 2020-05-01 (×2): 2 g via INTRAVENOUS
  Filled 2020-04-30 (×2): qty 100

## 2020-04-30 MED ORDER — ROSUVASTATIN CALCIUM 20 MG PO TABS
20.0000 mg | ORAL_TABLET | Freq: Every evening | ORAL | Status: DC
  Administered 2020-04-30: 20 mg via ORAL
  Filled 2020-04-30: qty 1

## 2020-04-30 MED ORDER — HYDROCODONE-ACETAMINOPHEN 10-325 MG PO TABS: 1.0000 | ORAL_TABLET | ORAL | Status: DC | PRN

## 2020-04-30 MED ORDER — MENTHOL 3 MG MT LOZG
1.0000 | LOZENGE | OROMUCOSAL | Status: DC | PRN
  Filled 2020-04-30: qty 9

## 2020-04-30 MED ORDER — ACETAMINOPHEN 650 MG RE SUPP: 650.0000 mg | RECTAL | Status: DC | PRN

## 2020-04-30 MED ORDER — FENTANYL CITRATE (PF) 250 MCG/5ML IJ SOLN
INTRAMUSCULAR | Status: AC
  Filled 2020-04-30: qty 5

## 2020-04-30 MED ORDER — MIDAZOLAM HCL 5 MG/5ML IJ SOLN
INTRAMUSCULAR | Status: DC | PRN
  Administered 2020-04-30: 2 mg via INTRAVENOUS

## 2020-04-30 MED ORDER — ONDANSETRON HCL 4 MG/2ML IJ SOLN
INTRAMUSCULAR | Status: AC
  Filled 2020-04-30: qty 2

## 2020-04-30 MED ORDER — DILTIAZEM HCL ER COATED BEADS 300 MG PO CP24
300.0000 mg | ORAL_CAPSULE | Freq: Every evening | ORAL | Status: DC
  Administered 2020-04-30: 300 mg via ORAL
  Filled 2020-04-30 (×2): qty 1

## 2020-04-30 MED ORDER — DEXAMETHASONE SODIUM PHOSPHATE 10 MG/ML IJ SOLN
INTRAMUSCULAR | Status: AC
  Filled 2020-04-30: qty 1

## 2020-04-30 MED ORDER — BACITRACIN ZINC 500 UNIT/GM EX OINT
TOPICAL_OINTMENT | CUTANEOUS | Status: DC | PRN
  Administered 2020-04-30: 1 via TOPICAL

## 2020-04-30 MED ORDER — FLUOXETINE HCL 20 MG PO CAPS
40.0000 mg | ORAL_CAPSULE | Freq: Every day | ORAL | Status: DC
  Administered 2020-05-01: 40 mg via ORAL
  Filled 2020-04-30: qty 2

## 2020-04-30 MED ORDER — DOCUSATE SODIUM 100 MG PO CAPS
100.0000 mg | ORAL_CAPSULE | Freq: Two times a day (BID) | ORAL | Status: DC
  Administered 2020-04-30 – 2020-05-01 (×2): 100 mg via ORAL
  Filled 2020-04-30: qty 1

## 2020-04-30 MED ORDER — ONDANSETRON HCL 4 MG/2ML IJ SOLN: 4.0000 mg | Freq: Four times a day (QID) | INTRAMUSCULAR | Status: DC | PRN

## 2020-04-30 MED ORDER — LACTATED RINGERS IV SOLN: INTRAVENOUS | Status: DC | PRN

## 2020-04-30 MED ORDER — THROMBIN 5000 UNITS EX SOLR
CUTANEOUS | Status: AC
  Filled 2020-04-30: qty 5000

## 2020-04-30 MED ORDER — ACETAMINOPHEN 500 MG PO TABS
1000.0000 mg | ORAL_TABLET | Freq: Four times a day (QID) | ORAL | Status: DC
  Administered 2020-04-30 (×2): 1000 mg via ORAL
  Filled 2020-04-30 (×2): qty 2

## 2020-04-30 MED ORDER — FENTANYL CITRATE (PF) 100 MCG/2ML IJ SOLN
INTRAMUSCULAR | Status: DC | PRN
  Administered 2020-04-30 (×3): 50 ug via INTRAVENOUS
  Administered 2020-04-30: 100 ug via INTRAVENOUS

## 2020-04-30 MED ORDER — MIDAZOLAM HCL 2 MG/2ML IJ SOLN
INTRAMUSCULAR | Status: AC
  Filled 2020-04-30: qty 2

## 2020-04-30 MED ORDER — CHLORHEXIDINE GLUCONATE 0.12 % MT SOLN
OROMUCOSAL | Status: AC
  Filled 2020-04-30: qty 15

## 2020-04-30 MED ORDER — CEFAZOLIN SODIUM-DEXTROSE 2-4 GM/100ML-% IV SOLN
2.0000 g | INTRAVENOUS | Status: AC
  Administered 2020-04-30: 2 g via INTRAVENOUS
  Filled 2020-04-30: qty 100

## 2020-04-30 MED ORDER — LACTATED RINGERS IV SOLN: INTRAVENOUS | Status: DC

## 2020-04-30 MED ORDER — PANTOPRAZOLE SODIUM 40 MG IV SOLR
40.0000 mg | Freq: Every day | INTRAVENOUS | Status: DC
  Administered 2020-04-30: 40 mg via INTRAVENOUS
  Filled 2020-04-30: qty 40

## 2020-04-30 MED ORDER — DEXMEDETOMIDINE (PRECEDEX) IN NS 20 MCG/5ML (4 MCG/ML) IV SYRINGE
PREFILLED_SYRINGE | INTRAVENOUS | Status: DC | PRN
  Administered 2020-04-30 (×3): 4 ug via INTRAVENOUS

## 2020-04-30 MED ORDER — PHENYLEPHRINE HCL (PRESSORS) 10 MG/ML IV SOLN
INTRAVENOUS | Status: DC | PRN
  Administered 2020-04-30 (×2): 80 ug via INTRAVENOUS

## 2020-04-30 MED ORDER — BISACODYL 10 MG RE SUPP: 10.0000 mg | Freq: Every day | RECTAL | Status: DC | PRN

## 2020-04-30 MED ORDER — DEXAMETHASONE SODIUM PHOSPHATE 4 MG/ML IJ SOLN
4.0000 mg | Freq: Four times a day (QID) | INTRAMUSCULAR | Status: AC
  Administered 2020-04-30 (×2): 4 mg via INTRAVENOUS
  Filled 2020-04-30 (×2): qty 1

## 2020-04-30 MED ORDER — ACETAMINOPHEN 500 MG PO TABS
1000.0000 mg | ORAL_TABLET | Freq: Once | ORAL | Status: AC
  Administered 2020-04-30: 1000 mg via ORAL
  Filled 2020-04-30: qty 2

## 2020-04-30 MED ORDER — ONDANSETRON HCL 4 MG/2ML IJ SOLN: 4.0000 mg | Freq: Once | INTRAMUSCULAR | Status: DC | PRN

## 2020-04-30 MED ORDER — ALUM & MAG HYDROXIDE-SIMETH 200-200-20 MG/5ML PO SUSP: 30.0000 mL | Freq: Four times a day (QID) | ORAL | Status: DC | PRN

## 2020-04-30 MED ORDER — CYCLOBENZAPRINE HCL 10 MG PO TABS
10.0000 mg | ORAL_TABLET | Freq: Three times a day (TID) | ORAL | Status: DC | PRN
  Administered 2020-04-30 – 2020-05-01 (×2): 10 mg via ORAL
  Filled 2020-04-30 (×2): qty 1

## 2020-04-30 MED ORDER — PROPOFOL 10 MG/ML IV BOLUS
INTRAVENOUS | Status: DC | PRN
  Administered 2020-04-30: 130 mg via INTRAVENOUS

## 2020-04-30 MED ORDER — CHLORHEXIDINE GLUCONATE CLOTH 2 % EX PADS: 6.0000 | MEDICATED_PAD | Freq: Once | CUTANEOUS | Status: DC

## 2020-04-30 MED ORDER — BUPIVACAINE-EPINEPHRINE (PF) 0.25% -1:200000 IJ SOLN
INTRAMUSCULAR | Status: DC | PRN
  Administered 2020-04-30: 10 mL

## 2020-04-30 MED ORDER — ZOLPIDEM TARTRATE 5 MG PO TABS
5.0000 mg | ORAL_TABLET | Freq: Every evening | ORAL | Status: DC | PRN
  Administered 2020-04-30: 5 mg via ORAL
  Filled 2020-04-30: qty 1

## 2020-04-30 MED ORDER — LORATADINE 10 MG PO TABS: 10.0000 mg | ORAL_TABLET | Freq: Every day | ORAL | Status: DC | PRN

## 2020-04-30 MED ORDER — PANTOPRAZOLE SODIUM 40 MG PO TBEC: 40.0000 mg | DELAYED_RELEASE_TABLET | Freq: Every day | ORAL | Status: DC | PRN

## 2020-04-30 MED ORDER — ROCURONIUM BROMIDE 10 MG/ML (PF) SYRINGE
PREFILLED_SYRINGE | INTRAVENOUS | Status: DC | PRN
  Administered 2020-04-30: 30 mg via INTRAVENOUS
  Administered 2020-04-30: 50 mg via INTRAVENOUS
  Administered 2020-04-30: 10 mg via INTRAVENOUS
  Administered 2020-04-30 (×2): 20 mg via INTRAVENOUS

## 2020-04-30 MED ORDER — POLYETHYLENE GLYCOL 3350 17 G PO PACK: 17.0000 g | PACK | Freq: Every day | ORAL | Status: DC

## 2020-04-30 MED ORDER — SENNA 8.6 MG PO TABS
2.0000 | ORAL_TABLET | Freq: Every day | ORAL | Status: DC
  Administered 2020-04-30: 17.2 mg via ORAL
  Filled 2020-04-30: qty 2

## 2020-04-30 MED ORDER — FENOFIBRATE 160 MG PO TABS
160.0000 mg | ORAL_TABLET | Freq: Every day | ORAL | Status: DC
  Administered 2020-04-30 – 2020-05-01 (×2): 160 mg via ORAL
  Filled 2020-04-30 (×2): qty 1

## 2020-04-30 MED ORDER — DICYCLOMINE HCL 20 MG PO TABS
20.0000 mg | ORAL_TABLET | Freq: Every day | ORAL | Status: DC | PRN
Start: 2020-04-30 — End: 2020-05-01
  Filled 2020-04-30: qty 1

## 2020-04-30 MED ORDER — ORAL CARE MOUTH RINSE: 15.0000 mL | Freq: Once | OROMUCOSAL | Status: AC

## 2020-04-30 MED ORDER — BACITRACIN ZINC 500 UNIT/GM EX OINT
TOPICAL_OINTMENT | CUTANEOUS | Status: AC
  Filled 2020-04-30: qty 28.35

## 2020-04-30 MED ORDER — LIDOCAINE 2% (20 MG/ML) 5 ML SYRINGE
INTRAMUSCULAR | Status: DC | PRN
  Administered 2020-04-30: 60 mg via INTRAVENOUS

## 2020-04-30 MED ORDER — ONDANSETRON HCL 4 MG/2ML IJ SOLN
INTRAMUSCULAR | Status: DC | PRN
  Administered 2020-04-30: 4 mg via INTRAVENOUS

## 2020-04-30 MED ORDER — PROPOFOL 10 MG/ML IV BOLUS
INTRAVENOUS | Status: AC
  Filled 2020-04-30: qty 20

## 2020-04-30 MED ORDER — DEXAMETHASONE SODIUM PHOSPHATE 10 MG/ML IJ SOLN
INTRAMUSCULAR | Status: DC | PRN
  Administered 2020-04-30: 10 mg via INTRAVENOUS

## 2020-04-30 MED ORDER — SODIUM CHLORIDE 0.9 % IV SOLN: INTRAVENOUS | Status: DC

## 2020-04-30 MED ORDER — FENTANYL CITRATE (PF) 100 MCG/2ML IJ SOLN
25.0000 ug | INTRAMUSCULAR | Status: DC | PRN
  Administered 2020-04-30: 50 ug via INTRAVENOUS

## 2020-04-30 MED ORDER — ONDANSETRON HCL 4 MG PO TABS: 4.0000 mg | ORAL_TABLET | Freq: Four times a day (QID) | ORAL | Status: DC | PRN

## 2020-04-30 SURGICAL SUPPLY — 63 items
BAND RUBBER #18 3X1/16 STRL (MISCELLANEOUS) IMPLANT
BENZOIN TINCTURE PRP APPL 2/3 (GAUZE/BANDAGES/DRESSINGS) ×3 IMPLANT
BIT DRILL NEURO 2X3.1 SFT TUCH (MISCELLANEOUS) ×1 IMPLANT
BLADE SURG 15 STRL LF DISP TIS (BLADE) ×1 IMPLANT
BLADE SURG 15 STRL SS (BLADE) ×2
BLADE ULTRA TIP 2M (BLADE) ×3 IMPLANT
BUR BARREL STRAIGHT FLUTE 4.0 (BURR) ×3 IMPLANT
BUR MATCHSTICK NEURO 3.0 LAGG (BURR) ×3 IMPLANT
CANISTER SUCT 3000ML PPV (MISCELLANEOUS) ×3 IMPLANT
CARTRIDGE OIL MAESTRO DRILL (MISCELLANEOUS) ×1 IMPLANT
CATH FOLEY 2WAY SLVR  5CC 14FR (CATHETERS) ×2
CATH FOLEY 2WAY SLVR 5CC 14FR (CATHETERS) ×1 IMPLANT
CLOSURE STERI-STRIP 1/2X4 (GAUZE/BANDAGES/DRESSINGS) ×1
CLOSURE WOUND 1/2 X4 (GAUZE/BANDAGES/DRESSINGS) ×1
CLSR STERI-STRIP ANTIMIC 1/2X4 (GAUZE/BANDAGES/DRESSINGS) ×2 IMPLANT
COVER MAYO STAND STRL (DRAPES) ×3 IMPLANT
COVER WAND RF STERILE (DRAPES) ×3 IMPLANT
DIFFUSER DRILL AIR PNEUMATIC (MISCELLANEOUS) ×3 IMPLANT
DRAIN JACKSON PRATT 10MM FLAT (MISCELLANEOUS) ×3 IMPLANT
DRAPE LAPAROTOMY 100X72 PEDS (DRAPES) ×3 IMPLANT
DRAPE MICROSCOPE LEICA (MISCELLANEOUS) IMPLANT
DRAPE SURG 17X23 STRL (DRAPES) ×6 IMPLANT
DRILL NEURO 2X3.1 SOFT TOUCH (MISCELLANEOUS) ×3
DRSG OPSITE POSTOP 3X4 (GAUZE/BANDAGES/DRESSINGS) ×3 IMPLANT
DRSG OPSITE POSTOP 4X6 (GAUZE/BANDAGES/DRESSINGS) ×3 IMPLANT
ELECT REM PT RETURN 9FT ADLT (ELECTROSURGICAL)
ELECTRODE REM PT RTRN 9FT ADLT (ELECTROSURGICAL) IMPLANT
EVACUATOR SILICONE 100CC (DRAIN) ×3 IMPLANT
GAUZE 4X4 16PLY RFD (DISPOSABLE) IMPLANT
GLOVE BIO SURGEON STRL SZ8 (GLOVE) ×3 IMPLANT
GLOVE BIO SURGEON STRL SZ8.5 (GLOVE) ×3 IMPLANT
GLOVE EXAM NITRILE XL STR (GLOVE) IMPLANT
GLOVE SURG UNDER POLY LF SZ6.5 (GLOVE) ×12 IMPLANT
GLOVE SURG UNDER POLY LF SZ7 (GLOVE) ×6 IMPLANT
GLOVE SURG UNDER POLY LF SZ7.5 (GLOVE) ×3 IMPLANT
GOWN STRL REUS W/ TWL LRG LVL3 (GOWN DISPOSABLE) ×2 IMPLANT
GOWN STRL REUS W/ TWL XL LVL3 (GOWN DISPOSABLE) ×1 IMPLANT
GOWN STRL REUS W/TWL LRG LVL3 (GOWN DISPOSABLE) ×4
GOWN STRL REUS W/TWL XL LVL3 (GOWN DISPOSABLE) ×2
HEMOSTAT POWDER KIT SURGIFOAM (HEMOSTASIS) ×3 IMPLANT
KIT BASIN OR (CUSTOM PROCEDURE TRAY) ×3 IMPLANT
KIT TURNOVER KIT B (KITS) ×3 IMPLANT
MARKER SKIN DUAL TIP RULER LAB (MISCELLANEOUS) ×3 IMPLANT
NEEDLE HYPO 22GX1.5 SAFETY (NEEDLE) ×3 IMPLANT
NEEDLE SPNL 18GX3.5 QUINCKE PK (NEEDLE) ×3 IMPLANT
NS IRRIG 1000ML POUR BTL (IV SOLUTION) ×3 IMPLANT
OIL CARTRIDGE MAESTRO DRILL (MISCELLANEOUS) ×3
PACK LAMINECTOMY NEURO (CUSTOM PROCEDURE TRAY) ×3 IMPLANT
PATTIES SURGICAL 1X1 (DISPOSABLE) ×3 IMPLANT
PEEK S VISTA 7X11X14 (Peek) ×9 IMPLANT
PIN DISTRACTION 14MM (PIN) ×6 IMPLANT
PLATE ANT CERV XTEND 3 LV 45 (Plate) ×3 IMPLANT
PUTTY DBM 5CC CALC GRAN ×3 IMPLANT
SCREW XTD VAR 4.2 SELF TAP 12 (Screw) ×24 IMPLANT
SPONGE INTESTINAL PEANUT (DISPOSABLE) ×6 IMPLANT
SPONGE SURGIFOAM ABS GEL 100 (HEMOSTASIS) IMPLANT
STRIP CLOSURE SKIN 1/2X4 (GAUZE/BANDAGES/DRESSINGS) ×2 IMPLANT
SUT VIC AB 0 CT1 27 (SUTURE) ×2
SUT VIC AB 0 CT1 27XBRD ANTBC (SUTURE) ×1 IMPLANT
SUT VIC AB 3-0 SH 8-18 (SUTURE) ×3 IMPLANT
TOWEL GREEN STERILE (TOWEL DISPOSABLE) ×3 IMPLANT
TOWEL GREEN STERILE FF (TOWEL DISPOSABLE) ×3 IMPLANT
WATER STERILE IRR 1000ML POUR (IV SOLUTION) ×3 IMPLANT

## 2020-04-30 NOTE — Op Note (Signed)
Brief history: The patient is a 70 year old white female who has complained of neck and arm pain consistent with a cervical radiculopathy/myelopathy.  She has failed medical management and was worked up with a cervical MRI and cervical x-rays which demonstrated a cervical spondylolisthesis, spondylosis stenosis, etc.  I discussed the various treatment options.  The patient has decided proceed with surgery.  Preoperative diagnosis: Cervical spondylolisthesis, cervical spondylosis with myelopathy and radiculopathy, cervical stenosis, cervicalgia  Postoperative diagnosis: The same  Procedure: C4-5, C5-6 and C6-7 anterior cervical discectomy/decompression; C4-5, C5-6 and C6-7 interbody arthrodesis with local morcellized autograft bone and Zimmer DBM; insertion of interbody prosthesis at C4-5, C5-6 and C6-7 (Zimmer peek interbody prosthesis); anterior cervical plating from C4-C7 with globus titanium plate  Surgeon: Dr. Delma Officer  Asst.: Hildred Priest, NP  Anesthesia: Gen. endotracheal  Estimated blood loss: 125 cc  Drains: None  Complications: None  Description of procedure: The patient was brought to the operating room by the anesthesia team. General endotracheal anesthesia was induced. A roll was placed under the patient's shoulders to keep the neck in the neutral position. The patient's anterior cervical region was then prepared with Betadine scrub and Betadine solution. Sterile drapes were applied.  The area to be incised was then injected with Marcaine with epinephrine solution. I then used a scalpel to make a transverse incision in the patient's left anterior neck. I used the Metzenbaum scissors to divide the platysmal muscle and then to dissect medial to the sternocleidomastoid muscle, jugular vein, and carotid artery. I carefully dissected down towards the anterior cervical spine identifying the esophagus and retracting it medially. Then using Kitner swabs to clear soft tissue from the  anterior cervical spine. We then inserted a bent spinal needle into the upper exposed intervertebral disc space. We then obtained intraoperative radiographs confirm our location.  I then used electrocautery to detach the medial border of the longus colli muscle bilaterally from the C4-5, C5-6 and C6-7 intervertebral disc spaces. I then inserted the Caspar self-retaining retractor underneath the longus colli muscle bilaterally to provide exposure.  We then incised the intervertebral disc at C4-5. We then performed a partial intervertebral discectomy with a pituitary forceps and the Karlin curettes. I then inserted distraction screws into the vertebral bodies at C4-C5. We then distracted the interspace. We then used the high-speed drill to decorticate the vertebral endplates at C4-5, to drill away the remainder of the intervertebral disc, to drill away some posterior spondylosis, and to thin out the posterior longitudinal ligament. I then incised ligament with the arachnoid knife. We then removed the ligament with a Kerrison punches undercutting the vertebral endplates and decompressing the thecal sac. We then performed foraminotomies about the bilateral C5 nerve roots. This completed the decompression at this level.  We then repeated this procedure in an analogous fashion at C5-6 and C6-7 decompressing the thecal sac and the bilateral C6 and C7 nerve roots.  We now turned our to attention to the interbody fusion. We used the trial spacers to determine the appropriate size for the interbody prosthesis. We then pre-filled prosthesis with a combination of local morcellized autograft bone that we obtained during decompression as well as Zimmer DBM. We then inserted the prosthesis into the distracted interspace at C4-5, C5-6 and C6-7. We then removed the distraction screws. There was a good snug fit of the prosthesis in the interspace.  Having completed the fusion we now turned attention to the anterior spinal  instrumentation. We used the high-speed drill to drill  away some anterior spondylosis at the disc spaces so that the plate lay down flat. We selected the appropriate length titanium anterior cervical plate. We laid it along the anterior aspect of the vertebral bodies from C4-C7. We then drilled 12 mm holes at C4, C5, C6 and C7. We then secured the plate to the vertebral bodies by placing two 12 mm self-tapping screws at C4, C5, C6 and C7. We then obtained intraoperative radiograph. The demonstrating good position of the instrumentation. We therefore secured the screws the plate the locking each cam. This completed the instrumentation.  We then obtained hemostasis using bipolar electrocautery. We irrigated the wound out with bacitracin solution. We then removed the retractor. We inspected the esophagus for any damage. There was none apparent.  We placed a 10 mm flat Jackson-Pratt drain in the prevertebral space and tunneled out through a separate stab wound.  We then reapproximated patient's platysmal muscle with interrupted 3-0 Vicryl suture. We then reapproximated the subcutaneous tissue with interrupted 3-0 Vicryl suture. The skin was reapproximated with Steri-Strips and benzoin. The wound was then covered with bacitracin ointment. A sterile dressing was applied. The drapes were removed. Patient was subsequently extubated by the anesthesia team and transported to the post anesthesia care unit in stable condition. All sponge instrument and needle counts were reportedly correct at the end of this case.

## 2020-04-30 NOTE — Anesthesia Preprocedure Evaluation (Addendum)
Anesthesia Evaluation  Patient identified by MRN, date of birth, ID band Patient awake    Reviewed: Allergy & Precautions, NPO status , Patient's Chart, lab work & pertinent test results  Airway Mallampati: II  TM Distance: >3 FB Neck ROM: Limited    Dental  (+) Teeth Intact, Dental Advisory Given, Caps,    Pulmonary asthma , former smoker,    Pulmonary exam normal breath sounds clear to auscultation       Cardiovascular hypertension, Pt. on medications Normal cardiovascular exam Rhythm:Regular Rate:Normal     Neuro/Psych  Headaches, PSYCHIATRIC DISORDERS Anxiety Depression CERVICAL SPONDYLOSIS WITH MYELOPATHY AND RADICULOPATHY    GI/Hepatic Neg liver ROS, GERD  Medicated,  Endo/Other  negative endocrine ROS  Renal/GU negative Renal ROS     Musculoskeletal  (+) Arthritis ,   Abdominal   Peds  Hematology negative hematology ROS (+)   Anesthesia Other Findings   Reproductive/Obstetrics                            Anesthesia Physical Anesthesia Plan  ASA: II  Anesthesia Plan: General   Post-op Pain Management:    Induction: Intravenous  PONV Risk Score and Plan: 3 and Midazolam, Dexamethasone and Ondansetron  Airway Management Planned: Oral ETT and Video Laryngoscope Planned  Additional Equipment:   Intra-op Plan:   Post-operative Plan: Extubation in OR  Informed Consent: I have reviewed the patients History and Physical, chart, labs and discussed the procedure including the risks, benefits and alternatives for the proposed anesthesia with the patient or authorized representative who has indicated his/her understanding and acceptance.     Dental advisory given  Plan Discussed with: CRNA  Anesthesia Plan Comments: (2nd PIV)      Anesthesia Quick Evaluation

## 2020-04-30 NOTE — H&P (Signed)
Subjective: The patient is a 70 year old white female who has complained of neck and bilateral arm pain, left great and right consistent with a cervical radiculopathy/myelopathy.  She has failed medical management and was worked up with a cervical MRI which demonstrated spondylosis and stenosis at C4-5, C5-6 and C6-7.  I discussed the various treatment options with her.  She has decided to proceed with surgery.  Past Medical History:  Diagnosis Date  . Anxiety   . Arthritis   . Asthma    "mild"- "I don't think I every had it."  . Depression   . GERD (gastroesophageal reflux disease)   . Head injury   . Headache   . Heart murmur    "had it forever"   . Hypertension   . Pneumonia   . Spondylosis of lumbosacral region   . Wears glasses     Past Surgical History:  Procedure Laterality Date  . CESAREAN SECTION    . COLONOSCOPY    . DG THUMB RIGHT HAND ([facility] HX)     joint replacement  . LUMBAR FUSION     x 2  . rectal fistula repair    . TONSILLECTOMY    . TUBAL LIGATION      Allergies  Allergen Reactions  . Phoslo [Calcium Acetate] Other (See Comments)    Body aches   . Shellfish Allergy Hives  . Sulfa Antibiotics Hives  . Mobic [Meloxicam] Nausea Only    Social History   Tobacco Use  . Smoking status: Former Games developer  . Smokeless tobacco: Never Used  . Tobacco comment: quit smoking cigaettes  aroun the year 2000  Substance Use Topics  . Alcohol use: Yes    Alcohol/week: 3.0 standard drinks    Types: 3 Glasses of wine per week    Comment: weekly     Family History  Problem Relation Age of Onset  . Renal Disease Mother   . Heart disease Father    Prior to Admission medications   Medication Sig Start Date End Date Taking? Authorizing Provider  aspirin EC 81 MG tablet Take 81 mg daily by mouth.   Yes [provider]  Biotin 5000 MCG TABS Take 5,000 mcg by mouth at bedtime.   Yes [provider]  buPROPion (WELLBUTRIN XL) 300 MG 24 hr tablet Take  300 mg by mouth daily.   Yes [provider]  cholecalciferol (VITAMIN D) 25 MCG (1000 UNIT) tablet Take 1,000 Units by mouth every evening.   Yes [provider]  dicyclomine (BENTYL) 20 MG tablet Take 20 mg by mouth daily as needed (abdominal spasms.).   Yes [provider]  diltiazem (CARDIZEM CD) 300 MG 24 hr capsule Take 300 mg every evening by mouth.    Yes [provider]  fenofibrate micronized (LOFIBRA) 134 MG capsule Take 134 mg every evening by mouth.    Yes [provider]  FLUoxetine (PROZAC) 40 MG capsule Take 40 mg by mouth daily.   Yes [provider]  HYDROcodone-acetaminophen (NORCO) 10-325 MG tablet Take 1 tablet by mouth in the morning and at bedtime.   Yes [provider]  loratadine (CLARITIN) 10 MG tablet Take 10 mg daily as needed by mouth for allergies or rhinitis.   Yes [provider]  Melatonin 10 MG CAPS Take 20 mg by mouth at bedtime.   Yes [provider]  milk thistle 175 MG tablet Take 175 mg by mouth every evening.   Yes [provider]  Misc Natural Products (COLON HERBAL CLEANSER PO) Take 2 capsules by mouth at bedtime. Vegetarian Colon Cleanse   Yes [provider]  naproxen sodium (ALEVE) 220 MG tablet Take 220 mg by mouth daily as needed (pain).   Yes [provider]  pantoprazole (PROTONIX) 40 MG tablet Take 40 mg by mouth daily as needed (acid reflux).   Yes [provider]  polyethylene glycol (MIRALAX / GLYCOLAX) 17 g packet Take 17 g by mouth daily.   Yes [provider]  rosuvastatin (CRESTOR) 20 MG tablet Take 20 mg every evening by mouth.    Yes [provider]  senna (SENOKOT) 8.6 MG TABS tablet Take 2 tablets by mouth at bedtime.   Yes [provider]     Review of Systems  Positive ROS: As above  All other systems have been reviewed and were otherwise negative with the exception of those mentioned in the  HPI and as above.  Objective: Vital signs in last 24 hours: Temp:  [98.3 F (36.8 C)] 98.3 F (36.8 C) (02/21 1037) Pulse Rate:  [70] 70 (02/21 1037) Resp:  [18] 18 (02/21 1037) BP: (150)/(83) 150/83 (02/21 1037) SpO2:  [100 %] 100 % (02/21 1037) Weight:  [59.4 kg] 59.4 kg (02/21 1037) Estimated body mass index is 23.96 kg/m as calculated from the following:   Height as of this encounter: 5\' 2"  (1.575 m).   Weight as of this encounter: 59.4 kg.   General Appearance: Alert Head: Normocephalic, without obvious abnormality, atraumatic Eyes: PERRL, conjunctiva/corneas clear, EOM's intact,    Ears: Normal  Throat: Normal  Neck: Supple, she has limited cervical range of motion. Back: Her lumbar incision is well-healed.  Lungs: Clear to auscultation bilaterally, respirations unlabored Heart: Regular rate and rhythm, no murmur, rub or gallop Abdomen: Soft, non-tender Extremities: Extremities normal, atraumatic, no cyanosis or edema Skin: unremarkable  NEUROLOGIC:   Mental status: alert and oriented,Motor Exam - grossly normal Sensory Exam - grossly normal Reflexes:  Coordination - grossly normal Gait - grossly normal Balance - grossly normal Cranial Nerves: I: smell Not tested  II: visual acuity  OS: Normal  OD: Normal   II: visual fields Full to confrontation  II: pupils Equal, round, reactive to light  III,VII: ptosis None  III,IV,VI: extraocular muscles  Full ROM  V: mastication Normal  V: facial light touch sensation  Normal  V,VII: corneal reflex  Present  VII: facial muscle function - upper  Normal  VII: facial muscle function - lower Normal  VIII: hearing Not tested  IX: soft palate elevation  Normal  IX,X: gag reflex Present  XI: trapezius strength  5/5  XI: sternocleidomastoid strength 5/5  XI: neck flexion strength  5/5  XII: tongue strength  Normal    Data Review Lab Results  Component Value Date   WBC 4.8 04/30/2020   HGB 12.3 04/30/2020   HCT 40.9  04/30/2020   MCV 101.0 (H) 04/30/2020   PLT 288 04/30/2020   Lab Results  Component Value Date   NA 141 01/01/2018   K 4.1 01/01/2018   CL 106 01/01/2018   CO2 24 01/01/2018   BUN 10 01/01/2018   CREATININE 0.76 01/01/2018   GLUCOSE 147 (H) 01/01/2018   No results found for: INR, PROTIME  Assessment/Plan: C4-5, C5-6 and 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  I have reviewed her imaging studies with her and pointed out the abnormalities.  We have discussed the various treatment options including surgery.  I have described the surgical treatment option of a C4-5, C5-6 and C6-7 anterior cervical discectomy, fusion and plating.  I have shown her surgical models.  I have given her a surgical pamphlet.  We have discussed the risk, benefits, alternatives, expected postoperative course and likelihood of achieving our goals with surgery.  I have answered all her questions.  She has decided to proceed with surgery.   Cristi Loron 04/30/2020 12:54 PM

## 2020-04-30 NOTE — Anesthesia Procedure Notes (Signed)
Procedure Name: Intubation Date/Time: 04/30/2020 1:15 PM Performed by: Gwenyth Allegra, CRNA Pre-anesthesia Checklist: Patient identified, Emergency Drugs available, Suction available and Patient being monitored Patient Re-evaluated:Patient Re-evaluated prior to induction Oxygen Delivery Method: Circle System Utilized Preoxygenation: Pre-oxygenation with 100% oxygen Induction Type: IV induction Ventilation: Mask ventilation without difficulty and Oral airway inserted - appropriate to patient size Laryngoscope Size: Glidescope and 3 Grade View: Grade I Tube type: Oral Tube size: 7.0 mm Number of attempts: 1 Airway Equipment and Method: Oral airway and Rigid stylet Placement Confirmation: ETT inserted through vocal cords under direct vision,  positive ETCO2 and breath sounds checked- equal and bilateral Secured at: 22 cm Tube secured with: Tape Dental Injury: Teeth and Oropharynx as per pre-operative assessment  Difficulty Due To: Difficulty was anticipated and Difficult Airway- due to reduced neck mobility Comments: Atraumatic intubation by Dianna Rossetti, SRNA. Elective Glidescope use d/t limited neck mobility.

## 2020-04-30 NOTE — Transfer of Care (Signed)
Immediate Anesthesia Transfer of Care Note  Patient: Lindsey Hill  Procedure(s) Performed: ANTERIOR CERVICAL DECOMPRESSION/DISCECTOMY FUSION , INTERBODY PROSTHESIS, PLATE/SCREWS CERVICAL FOUR-CERVICAL FIVE, CERVICAL FIVE-CERVICAL SIX,CERVICAL SIX-CERVICAL SEVEN (N/A Spine Cervical)  Patient Location: PACU  Anesthesia Type:General  Level of Consciousness: awake, alert  and oriented  Airway & Oxygen Therapy: Patient Spontanous Breathing  Post-op Assessment: Report given to RN and Post -op Vital signs reviewed and stable  Post vital signs: Reviewed and stable  Last Vitals:  Vitals Value Taken Time  BP 128/70 04/30/20 1641  Temp    Pulse 88 04/30/20 1643  Resp 17 04/30/20 1643  SpO2 95 % 04/30/20 1643  Vitals shown include unvalidated device data.  Last Pain:  Vitals:   04/30/20 1104  TempSrc:   PainSc: 0-No pain         Complications: No complications documented.

## 2020-04-30 NOTE — Progress Notes (Signed)
Subjective: The patient is somnolent but easily arousable.  She looks well.  She is in no apparent distress.  Objective: Vital signs in last 24 hours: Temp:  [97.1 F (36.2 C)-98.3 F (36.8 C)] 97.1 F (36.2 C) (02/21 1641) Pulse Rate:  [70-92] 84 (02/21 1656) Resp:  [13-18] 16 (02/21 1656) BP: (128-150)/(67-83) 128/67 (02/21 1656) SpO2:  [94 %-100 %] 94 % (02/21 1656) Weight:  [59.4 kg] 59.4 kg (02/21 1037) Estimated body mass index is 23.96 kg/m as calculated from the following:   Height as of this encounter: 5\' 2"  (1.575 m).   Weight as of this encounter: 59.4 kg.   Intake/Output from previous day: No intake/output data recorded. Intake/Output this shift: Total I/O In: 2800 [I.V.:2800] Out: 315 [Urine:90; Blood:225]  Physical exam the patient is somnolent but arousable.  She is moving all 4 extremities well.  Her deltoid strength is normal.  Her dressing is clean and dry.  There is no hematoma or shift.  Lab Results: Recent Labs    04/30/20 1122  WBC 4.8  HGB 12.3  HCT 40.9  PLT 288   BMET Recent Labs    04/30/20 1122  NA 139  K 5.9*  CL 109  CO2 19*  GLUCOSE 90  BUN 18  CREATININE 0.81  CALCIUM 9.6    Studies/Results: No results found.  Assessment/Plan: The patient is doing well.  I spoke with her husband.  LOS: 0 days     05/02/20 04/30/2020, 5:11 PM

## 2020-04-30 NOTE — Progress Notes (Signed)
Orthopedic Tech Progress Note Patient Details:  [name] 1950-09-13 [id number] Dropped off [facility]EN [facility]LLAR to PACU.  Patient ID: Lindsey Hill, female   DOB: 01/30/1951, 70 y.o.   MRN: [id number]   Donald Pore 04/30/2020, 5:16 PM

## 2020-05-01 ENCOUNTER — Encounter (HOSPITAL_COMMUNITY): Payer: Self-pay | Admitting: Neurosurgery

## 2020-05-01 MED ORDER — OXYCODONE-ACETAMINOPHEN 5-325 MG PO TABS
1.0000 | ORAL_TABLET | ORAL | 0 refills | Status: AC | PRN
Start: 2020-05-01 — End: ?

## 2020-05-01 MED ORDER — CYCLOBENZAPRINE HCL 10 MG PO TABS: 10.0000 mg | ORAL_TABLET | Freq: Three times a day (TID) | ORAL | 0 refills | Status: AC | PRN

## 2020-05-01 MED ORDER — OXYCODONE-ACETAMINOPHEN 5-325 MG PO TABS: 1.0000 | ORAL_TABLET | ORAL | Status: DC | PRN

## 2020-05-01 NOTE — Discharge Summary (Signed)
Physician Discharge Summary  Patient ID: Lindsey Hill MRN: [id number] DOB/AGE: 16-Jun-1950 70 y.o.  Admit date: 04/30/2020 Discharge date: 05/01/2020  Admission Diagnoses: Cervical spondylolisthesis, cervical spondylosis, cervical myelopathy, cervical radiculopathy, cervicalgia Discharge Diagnoses: The same Active Problems:   Cervical spondylosis with myelopathy and radiculopathy   Discharged Condition: good  Hospital Course: I performed a C4-5, C5-6 and C6-7 anterior cervical discectomy, fusion and plating on the patient on 04/30/2020.  The surgery went well.  The patient's postoperative course was unremarkable.  On postoperative day #1 she requested discharge to home.  She was given written and oral discharge instructions.  All her questions were answered.  Consults: PT, OT, care management Significant Diagnostic Studies: None Treatments: C4-5, C5-6 and C6-7 anterior cervical discectomy, fusion and plating. Discharge Exam: Blood pressure 131/84, pulse 89, temperature 98.5 F (36.9 C), temperature source Oral, resp. rate 18, height 5\' 2"  (1.575 m), weight 59.4 kg, SpO2 95 %. The patient is alert and pleasant.  Her strength is normal.  Her dressing is clean and dry.  There is no hematoma or shift.  Disposition: Home  Discharge Instructions    Call MD for:  difficulty breathing, headache or visual disturbances   Complete by: As directed    Call MD for:  extreme fatigue   Complete by: As directed    Call MD for:  hives   Complete by: As directed    Call MD for:  persistant dizziness or light-headedness   Complete by: As directed    Call MD for:  persistant nausea and vomiting   Complete by: As directed    Call MD for:  redness, tenderness, or signs of infection (pain, swelling, redness, odor or green/yellow discharge around incision site)   Complete by: As directed    Call MD for:  severe uncontrolled pain   Complete by: As directed    Call MD for:  temperature >100.4   Complete  by: As directed    Diet - low sodium heart healthy   Complete by: As directed    Discharge instructions   Complete by: As directed    Call [phone] for a followup appointment. Take a stool softener while you are using pain medications.   Driving Restrictions   Complete by: As directed    Do not drive for 2 weeks.   Increase activity slowly   Complete by: As directed    Lifting restrictions   Complete by: As directed    Do not lift more than 5 pounds. No excessive bending or twisting.   May shower / Bathe   Complete by: As directed    Remove the dressing for 3 days after surgery.  You may shower, but leave the incision alone.   Remove dressing in 48 hours   Complete by: As directed      Allergies as of 05/01/2020      Reactions   Phoslo [calcium Acetate] Other (See Comments)   Body aches    Shellfish Allergy Hives   Sulfa Antibiotics Hives   Mobic [meloxicam] Nausea Only      Medication List    STOP taking these medications   HYDROcodone-acetaminophen 10-325 MG tablet Commonly known as: NORCO   naproxen sodium 220 MG tablet Commonly known as: ALEVE     TAKE these medications   aspirin EC 81 MG tablet Take 81 mg daily by mouth.   Biotin 5000 MCG Tabs Take 5,000 mcg by mouth at bedtime.   buPROPion 300 MG 24 hr  tablet Commonly known as: WELLBUTRIN XL Take 300 mg by mouth daily.   cholecalciferol 25 MCG (1000 UNIT) tablet Commonly known as: VITAMIN D Take 1,000 Units by mouth every evening.   COLON HERBAL CLEANSER PO Take 2 capsules by mouth at bedtime. Vegetarian Colon Cleanse   cyclobenzaprine 10 MG tablet Commonly known as: FLEXERIL Take 1 tablet (10 mg total) by mouth 3 (three) times daily as needed for muscle spasms.   dicyclomine 20 MG tablet Commonly known as: BENTYL Take 20 mg by mouth daily as needed (abdominal spasms.).   diltiazem 300 MG 24 hr capsule Commonly known as: CARDIZEM CD Take 300 mg every evening by mouth.   fenofibrate  micronized 134 MG capsule Commonly known as: LOFIBRA Take 134 mg every evening by mouth.   FLUoxetine 40 MG capsule Commonly known as: PROZAC Take 40 mg by mouth daily.   loratadine 10 MG tablet Commonly known as: CLARITIN Take 10 mg daily as needed by mouth for allergies or rhinitis.   Melatonin 10 MG Caps Take 20 mg by mouth at bedtime.   milk thistle 175 MG tablet Take 175 mg by mouth every evening.   oxyCODONE-acetaminophen 5-325 MG tablet Commonly known as: PERCOCET/ROXICET Take 1-2 tablets by mouth every 4 (four) hours as needed for moderate pain.   pantoprazole 40 MG tablet Commonly known as: PROTONIX Take 40 mg by mouth daily as needed (acid reflux).   polyethylene glycol 17 g packet Commonly known as: MIRALAX / GLYCOLAX Take 17 g by mouth daily.   rosuvastatin 20 MG tablet Commonly known as: CRESTOR Take 20 mg every evening by mouth.   senna 8.6 MG Tabs tablet Commonly known as: SENOKOT Take 2 tablets by mouth at bedtime.        Signed: Cristi Loron 05/01/2020, 8:11 AM

## 2020-05-01 NOTE — Plan of Care (Signed)
Patient is discharged from room 3C05 at this time. Alert and in stable condition. IV site d/c'd and instructions read to patient with understanding verbalized and all questions answered. Left unit via wheelchair with all belongings at side.  

## 2020-05-01 NOTE — Anesthesia Postprocedure Evaluation (Signed)
Anesthesia Post Note  Patient: Lindsey Hill  Procedure(s) Performed: ANTERIOR CERVICAL DECOMPRESSION/DISCECTOMY FUSION , INTERBODY PROSTHESIS, PLATE/SCREWS CERVICAL FOUR-CERVICAL FIVE, CERVICAL FIVE-CERVICAL SIX,CERVICAL SIX-CERVICAL SEVEN (N/A Spine Cervical)     Patient location during evaluation: PACU Anesthesia Type: General Level of consciousness: awake and alert Pain management: pain level controlled Vital Signs Assessment: post-procedure vital signs reviewed and stable Respiratory status: spontaneous breathing, nonlabored ventilation, respiratory function stable and patient connected to nasal cannula oxygen Cardiovascular status: blood pressure returned to baseline and stable Postop Assessment: no apparent nausea or vomiting Anesthetic complications: no   No complications documented.  Last Vitals:  Vitals:   05/01/20 0344 05/01/20 0759  BP: (!) 144/86 131/84  Pulse: 86 89  Resp: 18 18  Temp: 36.8 C 36.9 C  SpO2: 95% 95%    Last Pain:  Vitals:   05/01/20 0759  TempSrc: Oral  PainSc:                  Kermitt Harjo S

## 2020-05-01 NOTE — Evaluation (Signed)
Occupational Therapy Evaluation Patient Details Name: Lindsey Hill MRN: [id number] DOB: 11-28-1950 Today's Date: 05/01/2020    History of Present Illness 70 y.o. female presenting with cervical radiculopathy/myelopathy s/p C4-5, C5-6 and C6-7 ACDF on 2/21 by Dr. Lovell Sheehan. PMHx significant for anxiety/depression, HTN and previous back surgery x2.   Clinical Impression   PTA patient was living with her husband in a multi-level private residence and was independent with ADLs without AD. Husband assists with IADLs including laundry and heavy housekeeping tasks. Patient also reports intermittent use of rollator in community dwellings. Patient currently presents near baseline level of function demonstrating bed mobility, short-distance functional mobility, and observed ADLs with Min guard to Supervision A. Patient requiring Min A to don footwear 2/2 inability to attain/maintain figure-4 position. Patient declined education on AE to increase independence with LB bathing/dressing tasks. OT provided education on spinal precautions and home set-up to maximize safety and independence with self-care tasks. Education also provided on donning/doffing aspen hard collar. Patient expressed verbal understanding. Patient does not require continued acute occupational therapy services with OT to sign off at this time.       Follow Up Recommendations  No OT follow up;Supervision - Intermittent    Equipment Recommendations  None recommended by OT (Patient has necessary DME.)    Recommendations for Other Services       Precautions / Restrictions Precautions Precautions: Cervical Precaution Booklet Issued: Yes (comment) Precaution Comments: Reviewed cervical precautions. Patient with good return demo during ADLs. Required Braces or Orthoses: Cervical Brace Cervical Brace: Hard collar Restrictions Weight Bearing Restrictions: No      Mobility Bed Mobility Overal bed mobility: Needs Assistance Bed Mobility:  Rolling;Sidelying to Sit Rolling: Supervision Sidelying to sit: Supervision       General bed mobility comments: _rail and supervision A for safety. Good carryover of edcuation on log rolling technique.    Transfers Overall transfer level: Needs assistance Equipment used: 1 person hand held assist;Rolling walker (2 wheeled) Transfers: Sit to/from Stand Sit to Stand: Min guard;Supervision         General transfer comment: Min guard progressing to supervision A for sit to stand transfers from various surfaces.    Balance Overall balance assessment: Needs assistance Sitting-balance support: No upper extremity supported;Feet supported Sitting balance-Leahy Scale: Good Sitting balance - Comments: Able to maintain static sitting balance at EOB without external assist.   Standing balance support: Single extremity supported;Bilateral upper extremity supported;During functional activity Standing balance-Leahy Scale: Fair Standing balance comment: Reliant on at least unilateral UE support with mobility. Able to maintain standing balance during LB dressing and toileting tasks with Min guard for steadying.                           ADL either performed or assessed with clinical judgement   ADL Overall ADL's : Needs assistance/impaired                 Upper Body Dressing : Set up;Sitting Upper Body Dressing Details (indicate cue type and reason): Education on compensatory technique. Lower Body Dressing: Minimal assistance;Sit to/from stand Lower Body Dressing Details (indicate cue type and reason): Min A to don footwear. Patient able to thread BLE thorugh LB clothing and hike over hips in standing with steadying assist only. Toilet Transfer: Orthoptist Details (indicate cue type and reason): Min guard HHA +1 for transfer to commode and supervision A with use of RW for transfer out of  bathroom. Toileting- Architect and  Hygiene: Min guard;Sit to/from stand Toileting - Clothing Manipulation Details (indicate cue type and reason): 3/3 parts of toileting task with Min guard for steadying.     Functional mobility during ADLs: Min guard;Supervision/safety;Rolling walker General ADL Comments: Min guard without AD and HHA and supevision A with use of RW for short-distance functional mobility.     Vision Baseline Vision/History: Wears glasses Wears Glasses: At all times Patient Visual Report: No change from baseline       Perception     Praxis      Pertinent Vitals/Pain Pain Assessment: 0-10 Pain Score: 4  Pain Location: Bilateral shoulders, thraot, and posterior aspect of bilateral arms. Pain Descriptors / Indicators: Aching;Guarding Pain Intervention(s): Limited activity within patient's tolerance;Monitored during session;Repositioned     Hand Dominance Right   Extremity/Trunk Assessment Upper Extremity Assessment Upper Extremity Assessment: Generalized weakness (AROM WFL. Did not assess above 90 degrees shoulder flexion 2/2 cervical precautions. Mild coordination deficits bilaterally.)   Lower Extremity Assessment Lower Extremity Assessment: Defer to PT evaluation   Cervical / Trunk Assessment Cervical / Trunk Assessment: Other exceptions Cervical / Trunk Exceptions: s/p cervical surgery. Hard collar in place.   Communication Communication Communication: No difficulties   Cognition Arousal/Alertness: Awake/alert Behavior During Therapy: WFL for tasks assessed/performed Overall Cognitive Status: Within Functional Limits for tasks assessed                                     General Comments  Clean, dry dressing at incision. Patient notes difficulty swallowing 2/2 ACDF.    Exercises     Shoulder Instructions      Home Living Family/patient expects to be discharged to:: Private residence Living Arrangements: Spouse/significant other Available Help at Discharge:  Family;Available 24 hours/day Type of Home: House Home Access: Stairs to enter [employer] of Steps: 4-5 Entrance Stairs-Rails: Right;Left;Can reach both Home Layout: Multi-level;Able to live on main level with bedroom/bathroom Alternate Level Stairs-Number of Steps: Full flight to laundry in basement and to 2nd level.   Bathroom Shower/Tub: Theme park manager: Yes How Accessible: Accessible via walker Home Equipment: Bedside commode;Walker - 4 wheels;Walker - 2 wheels;Wheelchair - manual;Shower seat          Prior Functioning/Environment Level of Independence: Independent        Comments: Independent with ADLs noting increased difficulty with [facility] tasks including managing oral hygiene products PTA. Husband assist with heavy IADLs but patient reports ability to make meals and do light housekeeping tasks. Patient reports use of rollator intermittently in community dwellings.        OT Problem List: Pain;Impaired balance (sitting and/or standing)      OT Treatment/Interventions:      OT Goals(Current goals can be found in the care plan section) Acute Rehab OT Goals Patient Stated Goal: To return home. OT Goal Formulation: With patient  OT Frequency:     Barriers to D/C:            Co-evaluation              AM-PAC OT "6 Clicks" Daily Activity     Outcome Measure Help from another person eating meals?: None Help from another person taking care of personal grooming?: A Little Help from another person toileting, which includes using toliet, bedpan, or urinal?: A Little Help from another person bathing (including washing, rinsing,  drying)?: A Little Help from another person to put on and taking off regular upper body clothing?: None Help from another person to put on and taking off regular lower body clothing?: A Little 6 Click Score: 20   End of Session Equipment Utilized During Treatment: Rolling  walker;Cervical collar Nurse Communication: Mobility status  Activity Tolerance: Patient tolerated treatment well Patient left: in chair;with call bell/phone within reach  OT Visit Diagnosis: Unsteadiness on feet (R26.81)                Time: 0626-9485 OT Time Calculation (min): 24 min Charges:  OT General Charges $OT Visit: 1 Visit OT Evaluation $OT Eval Low Complexity: 1 Low OT Treatments $Self Care/Home Management : 8-22 mins  Ariston Grandison H. OTR/L Supplemental OT, Department of rehab services [phone]  Broadus Costilla R H. 05/01/2020, 8:42 AM
